# Patient Record
Sex: Female | Born: 1960 | Race: White | Hispanic: No | Marital: Married | Smoking: Former smoker
Health system: Southern US, Community
[De-identification: ages and names within clinical notes are randomized; demographics above are authoritative.]

## PROBLEM LIST (undated history)

## (undated) DIAGNOSIS — K219 Gastro-esophageal reflux disease without esophagitis: Secondary | ICD-10-CM

## (undated) DIAGNOSIS — G4700 Insomnia, unspecified: Secondary | ICD-10-CM

## (undated) DIAGNOSIS — R234 Changes in skin texture: Secondary | ICD-10-CM

## (undated) DIAGNOSIS — R51 Headache: Secondary | ICD-10-CM

## (undated) DIAGNOSIS — R102 Pelvic and perineal pain: Secondary | ICD-10-CM

## (undated) DIAGNOSIS — E785 Hyperlipidemia, unspecified: Secondary | ICD-10-CM

## (undated) DIAGNOSIS — G8929 Other chronic pain: Secondary | ICD-10-CM

## (undated) DIAGNOSIS — N80129 Deep endometriosis of ovary, unspecified ovary: Secondary | ICD-10-CM

## (undated) DIAGNOSIS — R159 Full incontinence of feces: Secondary | ICD-10-CM

## (undated) DIAGNOSIS — Z8679 Personal history of other diseases of the circulatory system: Secondary | ICD-10-CM

## (undated) DIAGNOSIS — F329 Major depressive disorder, single episode, unspecified: Secondary | ICD-10-CM

## (undated) DIAGNOSIS — R519 Headache, unspecified: Secondary | ICD-10-CM

## (undated) DIAGNOSIS — N809 Endometriosis, unspecified: Secondary | ICD-10-CM

## (undated) DIAGNOSIS — F32A Depression, unspecified: Secondary | ICD-10-CM

## (undated) DIAGNOSIS — E669 Obesity, unspecified: Secondary | ICD-10-CM

## (undated) DIAGNOSIS — I10 Essential (primary) hypertension: Secondary | ICD-10-CM

## (undated) HISTORY — PX: APPENDECTOMY: SHX54

## (undated) HISTORY — DX: Hyperlipidemia, unspecified: E78.5

## (undated) HISTORY — DX: Gastro-esophageal reflux disease without esophagitis: K21.9

## (undated) HISTORY — DX: Pelvic and perineal pain: R10.2

## (undated) HISTORY — PX: UPPER GASTROINTESTINAL ENDOSCOPY: SHX188

## (undated) HISTORY — DX: Obesity, unspecified: E66.9

## (undated) HISTORY — DX: Endometriosis, unspecified: N80.9

## (undated) HISTORY — DX: Changes in skin texture: R23.4

## (undated) HISTORY — DX: Essential (primary) hypertension: I10

## (undated) HISTORY — PX: LEFT OOPHORECTOMY: SHX1961

## (undated) HISTORY — DX: Headache, unspecified: R51.9

## (undated) HISTORY — DX: Insomnia, unspecified: G47.00

## (undated) HISTORY — DX: Depression, unspecified: F32.A

## (undated) HISTORY — DX: Headache: R51

## (undated) HISTORY — DX: Major depressive disorder, single episode, unspecified: F32.9

## (undated) HISTORY — DX: Other chronic pain: G89.29

## (undated) HISTORY — DX: Deep endometriosis of ovary, unspecified ovary: N80.129

## (undated) HISTORY — PX: CARPAL TUNNEL RELEASE: SHX101

## (undated) HISTORY — DX: Full incontinence of feces: R15.9

## (undated) HISTORY — PX: BURCH PROCEDURE: SHX1273

---

## 1992-12-12 HISTORY — PX: TOTAL ABDOMINAL HYSTERECTOMY: SHX209

## 1997-12-12 HISTORY — PX: OTHER SURGICAL HISTORY: SHX169

## 1999-04-14 ENCOUNTER — Ambulatory Visit (HOSPITAL_COMMUNITY): Admission: RE | Admit: 1999-04-14 | Discharge: 1999-04-14 | Payer: Self-pay | Admitting: Family Medicine

## 1999-04-14 ENCOUNTER — Encounter: Payer: Self-pay | Admitting: Family Medicine

## 2000-03-14 ENCOUNTER — Other Ambulatory Visit: Admission: RE | Admit: 2000-03-14 | Discharge: 2000-03-14 | Payer: Self-pay | Admitting: Family Medicine

## 2000-05-11 ENCOUNTER — Inpatient Hospital Stay (HOSPITAL_COMMUNITY): Admission: RE | Admit: 2000-05-11 | Discharge: 2000-05-12 | Payer: Self-pay | Admitting: Gynecology

## 2000-05-11 ENCOUNTER — Encounter (INDEPENDENT_AMBULATORY_CARE_PROVIDER_SITE_OTHER): Payer: Self-pay | Admitting: Specialist

## 2001-05-15 ENCOUNTER — Other Ambulatory Visit: Admission: RE | Admit: 2001-05-15 | Discharge: 2001-05-15 | Payer: Self-pay | Admitting: Gynecology

## 2002-05-17 ENCOUNTER — Other Ambulatory Visit: Admission: RE | Admit: 2002-05-17 | Discharge: 2002-05-17 | Payer: Self-pay | Admitting: Gynecology

## 2003-06-03 ENCOUNTER — Other Ambulatory Visit: Admission: RE | Admit: 2003-06-03 | Discharge: 2003-06-03 | Payer: Self-pay | Admitting: Gynecology

## 2003-10-15 ENCOUNTER — Encounter: Admission: RE | Admit: 2003-10-15 | Discharge: 2003-10-15 | Payer: Self-pay | Admitting: Gynecology

## 2004-06-22 ENCOUNTER — Other Ambulatory Visit: Admission: RE | Admit: 2004-06-22 | Discharge: 2004-06-22 | Payer: Self-pay | Admitting: Gynecology

## 2004-11-16 ENCOUNTER — Encounter: Admission: RE | Admit: 2004-11-16 | Discharge: 2004-11-16 | Payer: Self-pay | Admitting: Gynecology

## 2005-06-23 ENCOUNTER — Other Ambulatory Visit: Admission: RE | Admit: 2005-06-23 | Discharge: 2005-06-23 | Payer: Self-pay | Admitting: Gynecology

## 2006-04-14 ENCOUNTER — Encounter: Admission: RE | Admit: 2006-04-14 | Discharge: 2006-04-14 | Payer: Self-pay | Admitting: Gynecology

## 2006-07-05 ENCOUNTER — Other Ambulatory Visit: Admission: RE | Admit: 2006-07-05 | Discharge: 2006-07-05 | Payer: Self-pay | Admitting: Gynecology

## 2007-12-13 HISTORY — PX: BREAST BIOPSY: SHX20

## 2008-05-08 DIAGNOSIS — F329 Major depressive disorder, single episode, unspecified: Secondary | ICD-10-CM | POA: Insufficient documentation

## 2008-05-08 DIAGNOSIS — N809 Endometriosis, unspecified: Secondary | ICD-10-CM | POA: Insufficient documentation

## 2008-05-08 DIAGNOSIS — E7800 Pure hypercholesterolemia, unspecified: Secondary | ICD-10-CM | POA: Insufficient documentation

## 2008-05-08 DIAGNOSIS — R110 Nausea: Secondary | ICD-10-CM | POA: Insufficient documentation

## 2008-05-08 DIAGNOSIS — R159 Full incontinence of feces: Secondary | ICD-10-CM | POA: Insufficient documentation

## 2008-05-08 DIAGNOSIS — G5600 Carpal tunnel syndrome, unspecified upper limb: Secondary | ICD-10-CM | POA: Insufficient documentation

## 2008-05-08 DIAGNOSIS — M722 Plantar fascial fibromatosis: Secondary | ICD-10-CM | POA: Insufficient documentation

## 2008-05-08 DIAGNOSIS — I10 Essential (primary) hypertension: Secondary | ICD-10-CM | POA: Insufficient documentation

## 2008-05-08 DIAGNOSIS — R498 Other voice and resonance disorders: Secondary | ICD-10-CM | POA: Insufficient documentation

## 2008-05-08 DIAGNOSIS — J45909 Unspecified asthma, uncomplicated: Secondary | ICD-10-CM | POA: Insufficient documentation

## 2008-05-08 DIAGNOSIS — E669 Obesity, unspecified: Secondary | ICD-10-CM | POA: Insufficient documentation

## 2008-05-08 DIAGNOSIS — J329 Chronic sinusitis, unspecified: Secondary | ICD-10-CM | POA: Insufficient documentation

## 2008-05-08 DIAGNOSIS — G4700 Insomnia, unspecified: Secondary | ICD-10-CM | POA: Insufficient documentation

## 2008-05-08 DIAGNOSIS — M7660 Achilles tendinitis, unspecified leg: Secondary | ICD-10-CM | POA: Insufficient documentation

## 2008-05-08 DIAGNOSIS — R109 Unspecified abdominal pain: Secondary | ICD-10-CM | POA: Insufficient documentation

## 2008-05-08 DIAGNOSIS — K219 Gastro-esophageal reflux disease without esophagitis: Secondary | ICD-10-CM | POA: Insufficient documentation

## 2008-05-08 DIAGNOSIS — R7309 Other abnormal glucose: Secondary | ICD-10-CM | POA: Insufficient documentation

## 2008-05-08 DIAGNOSIS — F411 Generalized anxiety disorder: Secondary | ICD-10-CM | POA: Insufficient documentation

## 2008-05-09 ENCOUNTER — Ambulatory Visit: Payer: Self-pay | Admitting: Internal Medicine

## 2008-05-20 ENCOUNTER — Ambulatory Visit: Payer: Self-pay | Admitting: Internal Medicine

## 2008-05-20 ENCOUNTER — Encounter: Payer: Self-pay | Admitting: Internal Medicine

## 2008-05-22 ENCOUNTER — Encounter: Payer: Self-pay | Admitting: Internal Medicine

## 2008-09-16 ENCOUNTER — Encounter: Admission: RE | Admit: 2008-09-16 | Discharge: 2008-09-16 | Payer: Self-pay | Admitting: Gynecology

## 2008-09-24 ENCOUNTER — Encounter (INDEPENDENT_AMBULATORY_CARE_PROVIDER_SITE_OTHER): Payer: Self-pay | Admitting: Diagnostic Radiology

## 2008-09-24 ENCOUNTER — Encounter: Admission: RE | Admit: 2008-09-24 | Discharge: 2008-09-24 | Payer: Self-pay | Admitting: Gynecology

## 2008-09-24 HISTORY — PX: MM BREAST STEREO BIOPSY LEFT (ARMC HX): HXRAD1824

## 2009-09-17 ENCOUNTER — Encounter: Admission: RE | Admit: 2009-09-17 | Discharge: 2009-09-17 | Payer: Self-pay | Admitting: Gynecology

## 2010-05-11 ENCOUNTER — Ambulatory Visit: Payer: Self-pay | Admitting: Internal Medicine

## 2010-05-13 ENCOUNTER — Telehealth: Payer: Self-pay | Admitting: Internal Medicine

## 2010-05-25 ENCOUNTER — Ambulatory Visit: Payer: Self-pay | Admitting: Internal Medicine

## 2010-05-27 ENCOUNTER — Encounter: Payer: Self-pay | Admitting: Internal Medicine

## 2010-09-20 ENCOUNTER — Encounter: Admission: RE | Admit: 2010-09-20 | Discharge: 2010-09-20 | Payer: Self-pay | Admitting: Gynecology

## 2010-09-27 ENCOUNTER — Encounter: Payer: Self-pay | Admitting: Internal Medicine

## 2011-01-11 NOTE — Progress Notes (Signed)
Summary: meds  Phone Note From Pharmacy Call back at [phone]   Caller: Charlotte Crumb tech Call For: Dr. Juanda Chance  Summary of Call: pt came to pharmacy to pick up meds but pharmacy doesnt have anything thing for pt... pt apparently didnt know what she was there to pick up, pharm tech is assuming it is Carafate... is pt supposed to be getting refill of this and if so, can the request be faxed in? Initial call taken by: Vallarie Mare,  May 13, 2010 2:17 PM  Follow-up for Phone Call        Patient told us at her office visit that she [facility], not [facility]. I have called and spoken to Maralyn Sago and she states that she will have it transferred from [place] to their facility.  Follow-up by: Lamona Curl CMA Duncan Dull),  May 13, 2010 2:40 PM

## 2011-01-11 NOTE — Miscellaneous (Signed)
Summary: rx for nexium  Clinical Lists Changes  Medications: Added new medication of NEXIUM 40 MG  CPDR (ESOMEPRAZOLE MAGNESIUM) 1 capsule twice a day 30 minutes before meals - Signed Rx of NEXIUM 40 MG  CPDR (ESOMEPRAZOLE MAGNESIUM) 1 capsule twice a day 30 minutes before meals;  #60 x 3;  Signed;  Entered by: Oda Cogan RN;  Authorized by: Hart Carwin MD;  Method used: Electronically to [facility] [address] [place], [place]  [address], Ph: [phone] or [phone], Fax: [phone]    Prescriptions: NEXIUM 40 MG  CPDR (ESOMEPRAZOLE MAGNESIUM) 1 capsule twice a day 30 minutes before meals  #60 x 3   Entered by:   Oda Cogan RN   Authorized by:   Hart Carwin MD   Signed by:   Oda Cogan RN on 05/25/2010   Method used:   Electronically to        [facility]* (retail)       [address] [place], [place]  [address]       Ph: [phone] or [phone]       Fax: [phone]   RxID:   [id number]

## 2011-01-11 NOTE — Assessment & Plan Note (Signed)
Summary: spitting up blood...as.   History of Present Illness Visit Type: Follow-up Visit Primary GI MD: [name] MD Primary Provider: Donald Prose Requesting Provider: na Chief Complaint: spitting up blood, and burning in throat  History of Present Illness:   This is a 50 year old white female with a history of gastroesophageal reflux initially evaluated in June 2005 with an upper endoscopy which showed findings of mild gastritis. She responded to Nexium 40 mg twice a day, Reglan and Carafate. She now has a recurrence of the coughing, burning in her throat and spitting up some blood tinged sputum. She does not smoke. An evaluation by an Allergist was negative. She has some odynophagia but no dysphagia. Current medications include Prilosec 20 mg twice a day and Zantac 300 mg in the middle of the day.   GI Review of Systems      Denies abdominal pain, acid reflux, belching, bloating, chest pain, dysphagia with liquids, dysphagia with solids, heartburn, loss of appetite, nausea, vomiting, vomiting blood, weight loss, and  weight gain.        Denies anal fissure, black tarry stools, change in bowel habit, constipation, diarrhea, diverticulosis, fecal incontinence, heme positive stool, hemorrhoids, irritable bowel syndrome, jaundice, light color stool, liver problems, rectal bleeding, and  rectal pain.    Current Medications (verified): 1)  Atenolol 50 Mg  Tabs (Atenolol) .... Take 1 Tablet By Mouth Once A Day 2)  Multivitamins   Tabs (Multiple Vitamin) .... Take 1 Tablet By Mouth Once A Day 3)  Vitamin B-12 Cr 1000 Mcg  Tbcr (Cyanocobalamin) .... Take 2 1/2 Tablets By Mouth Every Day 4)  Fluticasone Propionate 50 Mcg/act  Susp (Fluticasone Propionate) .... Insert 2 Sprays Into Each Nostril Every Day. 5)  Omeprazole 20 Mg  Tbec (Omeprazole) .... Take 1 Tablet By Mouth Two Times A Day 6)  Ranitidine Hcl 300 Mg  Caps (Ranitidine Hcl) .... Hs 7)  Fish Oil 1000 Mg  Caps (Omega-3 Fatty  Acids) .... Take 6000mg  Daily 8)  Cvs Folic Acid 400 Mcg  Tabs (Folic Acid) .... Take 1200mg  Daily 9)  Cymbalta 60 Mg Cpep (Duloxetine Hcl) .... One Tablet By Mouth Once Daily 10)  Citracal Plus Bone Density  Tabs (Multiple Minerals-Vitamins) .... Two Times A Day By Mouth 11)  Liquid Magnesium 400mg  .... Once Daily  Allergies (verified): 1)  ! Sulfa  Past History:  Past Medical History: Reviewed history from 05/08/2008 and no changes required. 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 Total Abdominal Hysterectomy Left Salpingo-oophorectomy Burch prcedure & culdoplasty for stress urinary incontinence Anal sphincter repair-internal & external Right carpal tunnel surgery  Family History: Family History of Diabetes:  Family History of Breast Cancer: Family History of Liver Cancer: Family History of Heart Disease:  Family History of Irritable Bowel Syndrome: No FH of Colon Cancer:  Social History: Reviewed history from 05/09/2008 and no changes required. Alcohol Use - yes- 2 drinks weekly Daily Caffeine Use Patient is a former smoker.   Review of Systems       The patient complains of sore throat.  The patient denies allergy/sinus, anemia, anxiety-new, arthritis/joint  pain, back pain, blood in urine, breast changes/lumps, change in vision, confusion,  cough, coughing up blood, depression-new, fainting, fatigue, fever, headaches-new, hearing problems, heart murmur, heart rhythm changes, itching, menstrual pain, muscle pains/cramps, night sweats, nosebleeds, pregnancy symptoms, shortness of breath, skin rash, sleeping problems, swelling of feet/legs, swollen lymph glands, thirst - excessive , urination - excessive , urination changes/pain, urine leakage, vision changes, and voice change.         Pertinent positive and negative review of systems were noted in the above HPI. All other ROS was otherwise negative.   Vital Signs:  Patient profile:   50 year old female Height:      67 inches Weight:      217 pounds BMI:     34.11 BSA:     2.10 Pulse rate:   72 / minute Pulse rhythm:   regular BP sitting:   120 / 76  (right arm) Cuff size:   regular  Vitals Entered By: Ok Anis CMA (May 11, 2010 2:13 PM)  Physical Exam  General:  alert, oriented and in no distress. Overweight. Eyes:  PERRLA, no icterus. Mouth:  No deformity or lesions, dentition normal. Neck:  Supple; no masses or thyromegaly. Chest Wall:  costochondral junctions not tender. Lungs:  Clear throughout to auscultation. Heart:  Regular rate and rhythm; no murmurs, rubs,  or bruits. Abdomen:  protuberant abdomen with normoactive bowel sounds and no focal tenderness. Extremities:  No clubbing, cyanosis, edema or deformities noted. Skin:  Intact without significant lesions or rashes. Psych:  Alert and cooperative. Normal mood and affect.   Impression & Recommendations:  Problem # 1:  GERD (ICD-530.81) Patient has recurrent burning of the throat and hoarseness with a prior history of severe gastroesophageal reflux poorly controlled on a proton pump inhibitor. We will proceed with an upper endoscopy to rule out Barrett's esophagus. I have given her samples of Nexium 40 mg twice a day in place of Prilosec. She will stay on antireflux measures and add Carafate slurry 10 cc p.o.  b.i.d.  Other Orders: EGD (EGD)  Patient Instructions: 1)  atireflux measures. 2)  Nexium 40 mg p.o. b.i.d. 3)  Carafate slurry 10 cc p.o. b.i.d. 4)  Upper endoscopy with biopsies. 5)  Copy sent to : Dr Judie Petit.Hamrick 6)  The medication list was reviewed and reconciled.  All changed / newly prescribed medications were explained.  A complete medication list was provided to the patient / caregiver. Prescriptions: CARAFATE 1 GM/10ML SUSP (SUCRALFATE) Take 10 cc (2 teaspoons) by mouth two times a day  #12 ounces x 1   Entered by:   Lamona Curl CMA (AAMA)   Authorized by:   Hart Carwin MD   Signed by:   Lamona Curl CMA (AAMA) on 05/11/2010   Method used:   Electronically to        [facility] (retail)       [address]       [place], [place]  [address]       Ph: [phone]       Fax: [phone]   RxID:   [id number]

## 2011-01-11 NOTE — Letter (Signed)
Summary: EGD Instructions  [facility]  [address] [place], [place] [address]   Phone: [phone]  Fax: [phone]       Jordan Christensen    03/24/1961    MRN: [id number]       Procedure Day /Date: 05/25/10 Tuesday     Arrival Time:  1:30 pm     Procedure Time: 2:30 pm     Location of Procedure:                    _x  _ [facility] (4th Floor)  PREPARATION FOR ENDOSCOPY   On 05/25/10 THE DAY OF THE PROCEDURE:  1.   No solid foods, milk or milk products are allowed after midnight the night before your procedure.  2.   Do not drink anything colored red or purple.  Avoid juices with pulp.  No orange juice.  3.  You may drink clear liquids until 12:30 pm, which is 2 hours before your procedure.                                                                                                CLEAR LIQUIDS INCLUDE: Water Jello Ice Popsicles Tea (sugar ok, no milk/cream) Powdered fruit flavored drinks Coffee (sugar ok, no milk/cream) Gatorade Juice: apple, white grape, white cranberry  Lemonade Clear bullion, consomm, broth Carbonated beverages (any kind) Strained chicken noodle soup Hard Candy   MEDICATION INSTRUCTIONS  Unless otherwise instructed, you should take regular prescription medications with a small sip of water as early as possible the morning of your procedure.                  OTHER INSTRUCTIONS  You will need a responsible adult at least 48 years of age to accompany you and drive you home.   This person must remain in the waiting room during your procedure.  Wear loose fitting clothing that is easily removed.  Leave jewelry and other valuables at home.  However, you may wish to bring a book to read or an iPod/MP3 player to listen to music as you wait for your procedure to start.  Remove all body piercing jewelry and leave at home.  Total time from sign-in until discharge is approximately 2-3 hours.  You should  go home directly after your procedure and rest.  You can resume normal activities the day after your procedure.  The day of your procedure you should not:   Drive   Make legal decisions   Operate machinery   Drink alcohol   Return to work  You will receive specific instructions about eating, activities and medications before you leave.    The above instructions have been reviewed and explained to me by   Lamona Curl CMA Duncan Dull)  May 11, 2010 2:55 PM     I fully understand and can verbalize these instructions _____________________________ Date 05/11/10

## 2011-01-11 NOTE — Letter (Signed)
Summary: Patient [facility] Biopsy Results  [facility]  [address] [place], [place] [address]   Phone: [phone]  Fax: [phone]        May 27, 2010 MRN: [id number]    [facility] [address] [place], [place]  [address]    Dear Ms. Manwarren,  I am pleased to inform you that the biopsies taken during your recent endoscopic examination did not show any evidence of cancer upon pathologic examination.Biopsy from Your esophagus shows mild esophagitis due to reflux  Additional information/recommendations:  __No further action is needed at this time.  Please follow-up with      your primary care physician for your other healthcare needs.  __ Please call [phone] to schedule a return visit to review      your condition.  _x_ Continue with the treatment plan as outlined on the day of your      exam.  __   Please call us if you are having persistent problems or have questions about your condition that have not been fully answered at this time.  Sincerely,  Hart Carwin MD  This letter has been electronically signed by your physician.  Appended Document: Patient Notice-Endo Biopsy Results letter mailed.

## 2011-01-11 NOTE — Miscellaneous (Signed)
Summary: Nexium Rx  Clinical Lists Changes  Medications: Removed medication of RANITIDINE HCL 300 MG  CAPS (RANITIDINE HCL) hs Removed medication of OMEPRAZOLE 20 MG  TBEC (OMEPRAZOLE) Take 1 tablet by mouth two times a day Changed medication from NEXIUM 40 MG  CPDR (ESOMEPRAZOLE MAGNESIUM) 1 capsule twice a day 30 minutes before meals to NEXIUM 40 MG  CPDR (ESOMEPRAZOLE MAGNESIUM) 1 capsule twice a day 30 minutes before meals - Signed Rx of NEXIUM 40 MG  CPDR (ESOMEPRAZOLE MAGNESIUM) 1 capsule twice a day 30 minutes before meals;  #60 x 3;  Signed;  Entered by: Lamona Curl CMA (AAMA);  Authorized by: Hart Carwin MD;  Method used: Electronically to [facility] [address] [place], [place], [place]  [address], Ph: [phone], Fax: [phone]    Prescriptions: NEXIUM 40 MG  CPDR (ESOMEPRAZOLE MAGNESIUM) 1 capsule twice a day 30 minutes before meals  #60 x 3   Entered by:   Lamona Curl CMA (AAMA)   Authorized by:   Hart Carwin MD   Signed by:   Lamona Curl CMA (AAMA) on 09/27/2010   Method used:   Electronically to        [facility] (retail)       [address]       [place], [place]  [address]       Ph: [phone]       Fax: [phone]   RxID:   [id number]   Appended Document: Nexium Rx originally sent [facility] in error. Dottie Nelson-Smith CMA Duncan Dull)  October 01, 2010 3:08 PM    Clinical Lists Changes  Medications: Rx of NEXIUM 40 MG  CPDR (ESOMEPRAZOLE MAGNESIUM) 1 capsule twice a day 30 minutes before meals;  #60 x 3;  Signed;  Entered by: Lamona Curl CMA (AAMA);  Authorized by: Hart Carwin MD;  Method used: Electronically to [facility]*, [address] [place], [place]  [address], Ph: [phone] or [phone], Fax: [phone]    Prescriptions: NEXIUM 40 MG  CPDR (ESOMEPRAZOLE MAGNESIUM) 1 capsule twice a day  30 minutes before meals  #60 x 3   Entered by:   Lamona Curl CMA (AAMA)   Authorized by:   Hart Carwin MD   Signed by:   Lamona Curl CMA (AAMA) on 10/01/2010   Method used:   Electronically to        [facility]* (retail)       [address] [place], [place]  [address]       Ph: [phone] or [phone]       Fax: [phone]   RxID:   [id number]

## 2011-01-11 NOTE — Procedures (Signed)
Summary: Upper Endoscopy  Patient: Jordan Christensen Note: All result statuses are Final unless otherwise noted.  Tests: (1) Upper Endoscopy (EGD)   EGD Upper Endoscopy       DONE     [facility]     [address]     [place], [place]  [address]           ENDOSCOPY PROCEDURE REPORT           PATIENT:  Jordan Christensen, Jordan Christensen  MR#:  [id number]     BIRTHDATE:  02-08-1961, 48 yrs. old  GENDER:  female           ENDOSCOPIST:  Hedwig Morton. Juanda Chance, MD     Referred by:  Burnell Blanks, M.D.           PROCEDURE DATE:  05/25/2010     PROCEDURE:  EGD with biopsy     ASA CLASS:  Class I     INDICATIONS:  GERD, heartburn EGD 2005 mild gastritis     now cough, regurgitation, improved on bid Nexiem           MEDICATIONS:   Versed 7 mg, Fentanyl 50 mcg     TOPICAL ANESTHETIC:  Exactacain Spray           DESCRIPTION OF PROCEDURE:   After the risks benefits and     alternatives of the procedure were thoroughly explained, informed     consent was obtained.  The  endoscope was introduced through the     mouth and advanced to the second portion of the duodenum, without     limitations.  The instrument was slowly withdrawn as the mucosa     was fully examined.     <<PROCEDUREIMAGES>>           irregular Z-line. slightly irregulat z-line with short tongue of     gastric mucosa With standard forceps, a biopsy was obtained and     sent to pathology (see image1 and image5).  Otherwise the     examination was normal (see image4, image3, and image2). no hiatal     hernia    Retroflexed views revealed no abnormalities.    The     scope was then withdrawn from the patient and the procedure     completed.           COMPLICATIONS:  None           ENDOSCOPIC IMPRESSION:     1) Irregular Z-line     2) Otherwise normal examination     no inflammatory changes     RECOMMENDATIONS:     1) Await biopsy results           REPEAT EXAM:  In 0 year(s) for.           ______________________________     Hedwig Morton.  Juanda Chance, MD           CC:           n.     eSIGNED:   Hedwig Morton. Antinio Sanderfer at 05/25/2010 03:03 PM           Laural Roes, [id number]  Note: An exclamation mark (!) indicates a result that was not dispersed into the flowsheet. Document Creation Date: 05/25/2010 3:04 PM _______________________________________________________________________  (1) Order result status: Final Collection or observation date-time: 05/25/2010 14:57 Requested date-time:  Receipt date-time:  Reported date-time:  Referring Physician:   Ordering Physician: [name] [phone]) Specimen Source:  Source: Kem Parkinson  Filler Order Number: [id number] Lab site:

## 2011-04-06 ENCOUNTER — Telehealth: Payer: Self-pay | Admitting: Internal Medicine

## 2011-04-06 NOTE — Telephone Encounter (Signed)
Patient states that the pharmacy told her they have faxed a prior authorization request to [place] on 2 separate occasions and that we have not yet responded. I have contacted [facility]. They state that they have faxed request to [phone]. I have asked them to fax the request once more to [phone]. They state that they will do so.

## 2011-04-07 NOTE — Telephone Encounter (Signed)
Actually, I have already gotten approval from [employer] but they are unable to locate appropriate paperwork in medical records showing that this has already been completed. Unfortunately, there is also no available phone numbers for [employer], only fax numbers. Therefore, I will fax over another authorization request form to see if they can put it through again.

## 2011-04-08 NOTE — Telephone Encounter (Signed)
I have gotten approval from [employer] for patient's Nexium #60 per 30 day period. I have sent the approval to medical records to be scanned in and have contacted patients pharmacy to advise them of the approval.

## 2011-04-08 NOTE — Telephone Encounter (Signed)
Advised patient that her Nexium has been approved through [employer].

## 2011-04-29 NOTE — Op Note (Signed)
[facility]  Patient:    ARTICE, HOLOHAN                      MRN: [id number] Proc. Date: 05/11/00 Adm. Date:  [id number] Attending:  Katrina Stack CC:         Luna Fuse, M.D.             Gretta Cool, M.D.                           Operative Report  PREOPERATIVE DIAGNOSES: 1. Incapacitating cyclic pelvic pain, post-total abdominal hysterectomy,    left salpingo-oophorectomy, and post-diagnostic laparoscopy and resection    of endometrioma, elsewhere, for severe endometriosis. 2. Incontinence of stool and gas, with previous obstetric laceration of    anal sphincter, elsewhere.  POSTOPERATIVE DIAGNOSES: 1. Incapacitating cyclic pelvic pain, post-total labdominal abdominal hysterectomy,    left salpingo-oophorectomy, and post-diagnostic laparoscopy and resection    of endometrioma elsewhere, for severe endometriosis. 2. Incontinence of stool and gas, with previous obstetric laceration of    anal sphincter, elsewhere.  PROCEDURE: 1. Laparoscopic right salpingo-oophorectomy. 2. Anal sphincter repair internal and external.  SURGEON:  Gretta Cool, M.D.  ASSISTANT:  Raynald Kemp, M.D.  ANESTHESIA:  General.  DESCRIPTION OF PROCEDURE:  Under excellent general anesthesia with the patients  abdomen prepped and draped in a modified lithotomy position, and Allen stirrups, a subumbilical incision was made, and a Veress cannula introduced.  After adequate pneumoperitoneum, the laparoscope and trocar were introduced and the pelvic organs visualized.  There was minimal adhesion in the pelvis, except to the left lateral pelvic wall, where the sigmoid colon was adherent.  On the right the ovary was covered with relatively dense-looking adhesions.  With nonsuppressive oral contraceptive therapy, her ovary was quite reduced in size.  It was suspended to the round ligament.  At this point accessory trocars were placed under  vision and the ovary and tube grasped with self-retaining forceps.  The Seitzinger tripolar forceps 5 mm size were then used to transect the round ligament once again, and  then to transect the infundibular pelvic vessels.  The intervening peritoneum was then also transected with a Seitzinger.  The Seitzinger tripolar forceps were then used to section the ovary to a small enough size so that it could be delivered n one piece through the laparoscope 10 mm port.  The entire ovary and fallopian tube was then delivered as one specimen through the 10 mm port.  At this point the pelvis was irrigated with lactated Ringers to remove any remaining debris.  At this point the fluid was evacuated and the gas allowed to escape.  The incisions were then closed with a deep suture of #5-0 Dexon.  Skin closure with Steri-Strips.  At the end of the procedure the patient was then reprepped and redraped for the  vaginal portion of the procedure, that is the anal sphincter repair.  An anal sphincter repair was begun by an incision over the anal sphincter, so as to remove the skin over the area of extensive loss of perineal body musculature and anal sphincters.  The incision was carried down to the scarred fascia from previous repair.  The ends of the anal sphincter were dissected free and then grasped with Allis clamps. The ends of the sphincter were then freshened.  The edges of the internal anal sphincter were then also identified and  plicated for a distance of approximately 3.0 cm down, above the anus.  The internal sphincter was closed with interrupted mattress sutures of #3-0 Vicryl.  Next, the edges of the anal sphincter were freshened and the anal sphincter was closed in an end-to-end anastomosis rather than an overlapping repair, because it was felt that the capsule of the nal sphincter muscle could be more physiologically plicated in this patient.  At this point, a circumferential 360  degree repair was undertaken using #3-0 PDS, and mattress sutures to include the entire capsule and the muscle tissue of the anal sphincter.  The sutures were placed approximately 3.0 to 4.0 mm apart, 365 degrees around the anus.  At this point, the accessory perineal body musculature was plicated in the midline, and the skin closed with subcuticular #3-0 PDS.  The skin was then also approximated with DermaBond, so as to seal particularly the anal kin and the skin of the perineal body, down to the margin of the anus, to prevent contamination from subsequent cleansing in the postoperative period.  At this point the procedure was terminated without complications.  The patient returned to the recovery room in excellent condition. DD:  05/11/00 TD:  05/11/00 Job: [id number]

## 2011-04-29 NOTE — H&P (Signed)
[facility]  Patient:    Jordan Christensen, Jordan Christensen                      MRN: [id number] Adm. Date:  [id number] Attending:  Katrina Stack CC:         Gretta Cool, M.D. (office x 2)                         History and Physical  CHIEF COMPLAINT:  Recurrent incapacitating cyclic pelvic pain, right lower quadrant.  HISTORY OF PRESENT ILLNESS:  A 50 year old white, married, gravida 2, para 2 with a history of recurring endometrioma left ovary with history of conservative management of endometrioma of the left ovary in 1992 by Dr. Arlyce Dice.  She had recurrence of endometrioma from 1993 and subsequently underwent total abdominal hysterectomy and left salpingo-oophorectomy for definitive therapy.  She also had involvement of the right ovary with multiple ___ type implants at the time of that procedure.  In addition, she had Burch procedure and culdoplasty for stress urinary incontinence.  Initially, she was much improved but now has recurrence of incapacitating right lower quadrant pain that is cyclic in nature and recurs each month during the time that she has breast sensitivity.  She has also had recurring cystic enlargement of her right ovary documented previously by ultrasound suggesting significant adhesion over the ovarian surface.  She also has difficulty with incontinence of gas and liquid stool increasingly severe in degree.  On examination of her anus, she has a dovetail sign with loss of anterior radiation of the anal sphincter.  She also has ultrasound documented loss of the anal sphincter anteriorly, both internal and external. On anal and vaginal examination, she has a thickness of only a few mm compatible with anal mucosa in direct apposition to the vaginal mucosa.  She appears to control reasonably well by constipation and by use of her levator muscles as accessory control.  She wishes definitive repair of that as well. I have discussed with her  the risks, benefits and the long-term risks of failure if she returns to a lifelong pattern of constipation and straining at stool. She is now admitted for definitive therapy by diagnostic laparoscopy, right salpingo-oophorectomy, possible laparotomy and for anal sphincter repair.  PAST MEDICAL HISTORY:  Usual childhood diseases without sequelae.  Medical illnesses:  None of consequence.  Accidents/injuries:  None.  Allergies: Multiple attack allergen, on Claritin.  DRUG ALLERGIES:  MACROBID.  PREVIOUS SURGERY: 1. Diagnostic laparoscopy in 1992 with laser excision of ovarian endometrioma. 2. In 1993, hysterectomy total and left salpingo-oophorectomy, Burch procedure    and culdoplasty, Dr. Arlyce Dice.  SOCIAL HISTORY:  Occasional social ethanol, denies tobacco.  Two living children, one is a teen.  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  Her husband works with the [employer] of [employer].  REVIEW OF SYSTEMS:  HEENT:  Denies symptoms.  CARDIORESPIRATORY:  Denies asthma, cough, bronchitis, shortness of breath.  GI/GU:  Recurring UTIs and cystitis.  No history of pyelonephritis.  PHYSICAL EXAMINATION:  GENERAL:  Well-developed, well-nourished white female, moderately over ideal weight.  HEENT:  Pupils equal, react to light and accommodation.  Fundi not examined. Oropharynx clear.  NECK:  Supple without masses or enlargement.  CHEST:  Clear to P&A.  BREASTS:  Soft without mass, nodes, nipple discharge.  HEART:  Regular rhythm without murmur or cardiac enlargement.  ABDOMEN:  Soft, scaphoid without mass or organomegaly.  PELVIC EXAM:  External genitalia normal female.  Vagina clean, rugous. Cervix and uterus surgically absent.  Vaginal cuff is well supported, as is the anterior vaginal wall.  The posterior vaginal wall support is reasonably intact with the exception of the anal sphincter.  There is no more than a few mm of tissue between the vaginal and rectal examining fingers and  there is clear evidence of disruption of the anal sphincter, both internal and external.  Much of the perineal body musculature is also separated widely.  RECTAL EXAM:  No masses. DD:  05/11/00 TD:  05/11/00 Job: [id number]

## 2011-05-27 ENCOUNTER — Other Ambulatory Visit: Payer: Self-pay | Admitting: Gynecology

## 2011-07-11 ENCOUNTER — Other Ambulatory Visit: Payer: Self-pay | Admitting: *Deleted

## 2011-07-11 MED ORDER — ESOMEPRAZOLE MAGNESIUM 40 MG PO CPDR: 40.0000 mg | DELAYED_RELEASE_CAPSULE | Freq: Two times a day (BID) | ORAL | Status: DC

## 2011-08-19 ENCOUNTER — Ambulatory Visit (INDEPENDENT_AMBULATORY_CARE_PROVIDER_SITE_OTHER): Payer: BC Managed Care – PPO | Admitting: Internal Medicine

## 2011-08-19 ENCOUNTER — Encounter: Payer: Self-pay | Admitting: Internal Medicine

## 2011-08-19 VITALS — BP 132/80 | HR 68 | Ht 67.0 in | Wt 242.0 lb

## 2011-08-19 DIAGNOSIS — K219 Gastro-esophageal reflux disease without esophagitis: Secondary | ICD-10-CM

## 2011-08-19 DIAGNOSIS — K2270 Barrett's esophagus without dysplasia: Secondary | ICD-10-CM

## 2011-08-19 MED ORDER — ESOMEPRAZOLE MAGNESIUM 40 MG PO CPDR: 40.0000 mg | DELAYED_RELEASE_CAPSULE | Freq: Two times a day (BID) | ORAL | Status: DC

## 2011-08-19 NOTE — Patient Instructions (Signed)
We have sent the following medications to your pharmacy for you to pick up at your convenience: Nexium You may contact someone to sign up for lapband information session at [phone]. You will be due for a recall endoscopy/colonoscopy in 07/2013. We will send you a reminder in the mail when it gets closer to that time. CC: Dr Nathanial Rancher

## 2011-08-19 NOTE — Progress Notes (Signed)
Jordan Christensen July 30, 1961 MRN [id number]   History of Present Illness:  This is a 50 year old white female with severe gastroesophageal reflux currently well-controlled on Nexium 40 mg by mouth twice a day. She was diagnosed with Barrett's esophagus on an upper endoscopy in June 2009. His last endoscopy in June 2011 did not confirm Barrett's esophagus. She is here to refill her Nexium. She denies dysphagia, odynophagia, sweats or cough. She is interested in lap-band surgery for weight reduction.   Past Medical History  Diagnosis Date  . Endometrioma     hx  . Chronic pelvic pain in female   . Fecal incontinence   . Hyperlipidemia   . Depression   . Insomnia   . Hypertension   . Obesity   . Chronic sinusitis   . Asthma   . GERD (gastroesophageal reflux disease)    Past Surgical History  Procedure Date  . Total abdominal hysterectomy   . Left oophorectomy   . Burch procedure     and culdoplasty for stress urinary incontinence  . Anal sphincter repair     internal and external  . Carpal tunnel release     right    reports that she has quit smoking. She has never used smokeless tobacco. She reports that she drinks about one ounce of alcohol per week. She reports that she does not use illicit drugs. family history includes Breast cancer in her maternal aunt; Diabetes in her mother; Heart disease in her mother; and Liver cancer in her father.  There is no history of Colon cancer. Allergies  Allergen Reactions  . Sulfonamide Derivatives         Review of Systems:  The remainder of the 10  point ROS is negative except as outlined in H&P   Physical Exam: General appearance  Well developed, in no distress. Eyes- non icteric. HEENT nontraumatic, normocephalic. Mouth no lesions, tongue papillated, no cheilosis. Neck supple without adenopathy, thyroid not enlarged, no carotid bruits, no JVD. Lungs Clear to auscultation bilaterally. Cor normal S1 normal S2, regular rhythm ,  no murmur,  quiet precordium. Abdomen soft, nontender normoactive bowel sounds. Rectal: Deferred. Extremities no pedal edema. Skin no lesions. Neurological alert and oriented x 3. Psychological normal mood and affect.  Assessment and Plan:  Problem #1 Chronic gastroesophageal reflux which is well controlled on Nexium 40 mg by mouth twice a day. She should continue antireflux measures. We will refill Nexium 40 by mouth twice a day. A telephone number contact was given to the patient to schedule instructions on gastric bypass and lapband surgery. A recall upper endoscopy will be due in August 2014. She will at that time have a screening colonoscopy as well.   08/19/2011 [name]

## 2011-09-07 ENCOUNTER — Other Ambulatory Visit: Payer: Self-pay | Admitting: Gynecology

## 2011-09-07 DIAGNOSIS — Z1231 Encounter for screening mammogram for malignant neoplasm of breast: Secondary | ICD-10-CM

## 2011-09-22 ENCOUNTER — Ambulatory Visit
Admission: RE | Admit: 2011-09-22 | Discharge: 2011-09-22 | Disposition: A | Discharge status: Home / Self Care | Payer: BC Managed Care – PPO | Source: Ambulatory Visit | Attending: Gynecology | Admitting: Gynecology

## 2011-09-22 DIAGNOSIS — Z1231 Encounter for screening mammogram for malignant neoplasm of breast: Secondary | ICD-10-CM

## 2011-09-28 ENCOUNTER — Other Ambulatory Visit: Payer: Self-pay | Admitting: Gynecology

## 2011-09-28 DIAGNOSIS — R928 Other abnormal and inconclusive findings on diagnostic imaging of breast: Secondary | ICD-10-CM

## 2011-10-11 ENCOUNTER — Ambulatory Visit
Admission: RE | Admit: 2011-10-11 | Discharge: 2011-10-11 | Disposition: A | Discharge status: Home / Self Care | Payer: BC Managed Care – PPO | Source: Ambulatory Visit | Attending: Gynecology | Admitting: Gynecology

## 2011-10-11 DIAGNOSIS — R928 Other abnormal and inconclusive findings on diagnostic imaging of breast: Secondary | ICD-10-CM

## 2012-03-06 ENCOUNTER — Other Ambulatory Visit: Payer: Self-pay | Admitting: Gynecology

## 2012-03-06 DIAGNOSIS — R920 Mammographic microcalcification found on diagnostic imaging of breast: Secondary | ICD-10-CM

## 2012-03-20 ENCOUNTER — Ambulatory Visit
Admission: RE | Admit: 2012-03-20 | Discharge: 2012-03-20 | Disposition: A | Discharge status: Home / Self Care | Payer: BC Managed Care – PPO | Source: Ambulatory Visit | Attending: Gynecology | Admitting: Gynecology

## 2012-03-20 ENCOUNTER — Other Ambulatory Visit: Payer: Self-pay | Admitting: *Deleted

## 2012-03-20 DIAGNOSIS — R920 Mammographic microcalcification found on diagnostic imaging of breast: Secondary | ICD-10-CM

## 2012-03-20 MED ORDER — ESOMEPRAZOLE MAGNESIUM 40 MG PO CPDR: 40.0000 mg | DELAYED_RELEASE_CAPSULE | Freq: Two times a day (BID) | ORAL | Status: DC

## 2012-06-21 ENCOUNTER — Other Ambulatory Visit: Payer: Self-pay | Admitting: Gynecology

## 2012-07-17 ENCOUNTER — Other Ambulatory Visit: Payer: Self-pay | Admitting: *Deleted

## 2012-07-17 MED ORDER — ESOMEPRAZOLE MAGNESIUM 40 MG PO CPDR: 40.0000 mg | DELAYED_RELEASE_CAPSULE | Freq: Two times a day (BID) | ORAL | Status: DC

## 2012-08-15 ENCOUNTER — Other Ambulatory Visit: Payer: Self-pay | Admitting: Gynecology

## 2012-08-15 DIAGNOSIS — R921 Mammographic calcification found on diagnostic imaging of breast: Secondary | ICD-10-CM

## 2012-09-21 ENCOUNTER — Ambulatory Visit (INDEPENDENT_AMBULATORY_CARE_PROVIDER_SITE_OTHER): Payer: BC Managed Care – PPO | Admitting: Internal Medicine

## 2012-09-21 ENCOUNTER — Encounter: Payer: Self-pay | Admitting: Internal Medicine

## 2012-09-21 VITALS — BP 134/68 | HR 80 | Ht 67.0 in | Wt 194.0 lb

## 2012-09-21 DIAGNOSIS — K219 Gastro-esophageal reflux disease without esophagitis: Secondary | ICD-10-CM

## 2012-09-21 DIAGNOSIS — K2270 Barrett's esophagus without dysplasia: Secondary | ICD-10-CM

## 2012-09-21 MED ORDER — RANITIDINE HCL 150 MG PO TABS: 150.0000 mg | ORAL_TABLET | Freq: Every day | ORAL | Status: DC

## 2012-09-21 MED ORDER — ESOMEPRAZOLE MAGNESIUM 40 MG PO CPDR: 40.0000 mg | DELAYED_RELEASE_CAPSULE | Freq: Two times a day (BID) | ORAL | Status: DC

## 2012-09-21 NOTE — Patient Instructions (Addendum)
We have sent the following medications to your pharmacy for you to pick up at your convenience: Nexium Ranitadine You will be due for a recall endoscopy/colonoscopy in 07/2013. We will send you a reminder in the mail when it gets closer to that time. CC: Dr Burnell Blanks

## 2012-09-21 NOTE — Progress Notes (Signed)
CHLO… BLEAK 11/07/1961 MRN [id number]  History of Present Illness:  This is a 51 year old white female with a history of Barrett's esophagus on endoscopy in June 2009 and chronic gastroesophageal reflux which is only partially responsive to Nexium 40 mg twice a day. She has occasional dysphagia to solids. Her last upper endoscopy in June 2011 did not show any Barrett's esophagus. She has intentionally lost 50 pounds since last September 2012. She denies cough or hoarseness.   Past Medical History  Diagnosis Date  . Endometrioma     hx  . Chronic pelvic pain in female   . Fecal incontinence   . Hyperlipidemia   . Depression   . Insomnia   . Hypertension   . Obesity   . Chronic sinusitis   . Asthma   . GERD (gastroesophageal reflux disease)    Past Surgical History  Procedure Date  . Total abdominal hysterectomy   . Left oophorectomy   . Burch procedure     and culdoplasty for stress urinary incontinence  . Anal sphincter repair     internal and external  . Carpal tunnel release     right    reports that she has quit smoking. She has never used smokeless tobacco. She reports that she drinks about one ounce of alcohol per week. She reports that she does not use illicit drugs. family history includes Breast cancer in her maternal aunt; Diabetes in her mother; Heart disease in her mother; and Liver cancer in her father.  There is no history of Colon cancer. Allergies  Allergen Reactions  . Sulfonamide Derivatives         Review of Systems: Negative for abdominal pain or change in bowel habits  The remainder of the 10 point ROS is negative except as outlined in H&P   Physical Exam: General appearance  Well developed, in no distress. Eyes- non icteric. HEENT nontraumatic, normocephalic. Mouth no lesions, tongue papillated, no cheilosis. Neck supple without adenopathy, thyroid not enlarged, no carotid bruits, no JVD. Lungs Clear to auscultation bilaterally. Cor  normal S1, normal S2, regular rhythm, no murmur,  quiet precordium. Abdomen: Soft nontender. Rectal: Not done. Extremities no pedal edema. Skin no lesions. Neurological alert and oriented x 3. Psychological normal mood and affect.  Assessment and Plan:  Problem #1 Chronic gastroesophageal reflux disease refractory to proton pump inhibitors. We have discussed Nissen fundoplication but she would like to continue conservative treatment. We will add ranitidine 150 mg in the middle of the day. She has used antireflux measures including  head of the bed elevation and continued weight loss. She will be due for a recall upper endoscopy in August 2014.  Problem #2 Colorectal cancer screening. We will plan for her to have a colonoscopy in August 2014 at the time of her upper endoscopy.   09/21/2012 [name]

## 2012-09-24 ENCOUNTER — Ambulatory Visit
Admission: RE | Admit: 2012-09-24 | Discharge: 2012-09-24 | Disposition: A | Discharge status: Home / Self Care | Payer: BC Managed Care – PPO | Source: Ambulatory Visit | Attending: Gynecology | Admitting: Gynecology

## 2012-09-24 DIAGNOSIS — R921 Mammographic calcification found on diagnostic imaging of breast: Secondary | ICD-10-CM

## 2012-12-12 HISTORY — PX: COLONOSCOPY: SHX174

## 2013-01-21 ENCOUNTER — Other Ambulatory Visit: Payer: Self-pay | Admitting: Otolaryngology

## 2013-01-21 DIAGNOSIS — J329 Chronic sinusitis, unspecified: Secondary | ICD-10-CM

## 2013-01-22 ENCOUNTER — Ambulatory Visit
Admission: RE | Admit: 2013-01-22 | Discharge: 2013-01-22 | Disposition: A | Discharge status: Home / Self Care | Payer: BC Managed Care – PPO | Source: Ambulatory Visit | Attending: Otolaryngology | Admitting: Otolaryngology

## 2013-01-22 DIAGNOSIS — J329 Chronic sinusitis, unspecified: Secondary | ICD-10-CM

## 2013-01-26 ENCOUNTER — Other Ambulatory Visit: Payer: Self-pay

## 2013-06-25 ENCOUNTER — Encounter: Payer: Self-pay | Admitting: Internal Medicine

## 2013-08-20 ENCOUNTER — Ambulatory Visit (AMBULATORY_SURGERY_CENTER): Payer: Self-pay

## 2013-08-20 VITALS — Ht 67.0 in | Wt 225.0 lb

## 2013-08-20 DIAGNOSIS — Z1211 Encounter for screening for malignant neoplasm of colon: Secondary | ICD-10-CM

## 2013-08-20 DIAGNOSIS — K2270 Barrett's esophagus without dysplasia: Secondary | ICD-10-CM

## 2013-08-20 MED ORDER — MOVIPREP 100 G PO SOLR: ORAL | Status: DC

## 2013-08-22 ENCOUNTER — Encounter: Payer: Self-pay | Admitting: Internal Medicine

## 2013-09-04 ENCOUNTER — Encounter: Payer: Self-pay | Admitting: Internal Medicine

## 2013-09-04 ENCOUNTER — Ambulatory Visit (AMBULATORY_SURGERY_CENTER): Payer: BC Managed Care – PPO | Admitting: Internal Medicine

## 2013-09-04 VITALS — BP 155/86 | HR 86 | Temp 97.3°F | Resp 14 | Ht 67.0 in | Wt 225.0 lb

## 2013-09-04 DIAGNOSIS — Z1211 Encounter for screening for malignant neoplasm of colon: Secondary | ICD-10-CM

## 2013-09-04 DIAGNOSIS — D126 Benign neoplasm of colon, unspecified: Secondary | ICD-10-CM

## 2013-09-04 DIAGNOSIS — K219 Gastro-esophageal reflux disease without esophagitis: Secondary | ICD-10-CM

## 2013-09-04 DIAGNOSIS — K2270 Barrett's esophagus without dysplasia: Secondary | ICD-10-CM

## 2013-09-04 MED ORDER — SODIUM CHLORIDE 0.9 % IV SOLN: 500.0000 mL | INTRAVENOUS | Status: DC

## 2013-09-04 NOTE — Op Note (Signed)
[facility] [address] [place], [address]   COLONOSCOPY PROCEDURE REPORT  PATIENT: Jordan Christensen, Jordan Christensen  MR#: [id number] BIRTHDATE: 1961-01-29 , 52  yrs. old GENDER: Female ENDOSCOPIST: Hart Carwin, MD REFERRED XB:JYNWG Hamrick, M.D. PROCEDURE DATE:  09/04/2013 PROCEDURE:   Colonoscopy with cold biopsy polypectomy First Screening Colonoscopy - Avg.  risk and is 50 yrs.  old or older Yes.  Prior Negative Screening - Now for repeat screening. N/A  History of Adenoma - Now for follow-up colonoscopy & has been > or = to 3 yrs.  N/A  Polyps Removed Today? Yes. ASA CLASS:   Class II INDICATIONS:Average risk patient for colon cancer. MEDICATIONS: MAC sedation, administered by CRNA and propofol (Diprivan) 350mg  IV  DESCRIPTION OF PROCEDURE:   After the risks benefits and alternatives of the procedure were thoroughly explained, informed consent was obtained.  A digital rectal exam revealed no abnormalities of the rectum.   The LB [id number]  endoscope was introduced through the anus and advanced to the cecum, which was identified by both the appendix and ileocecal valve. No adverse events experienced.   The quality of the prep was good, using MoviPrep  The instrument was then slowly withdrawn as the colon was fully examined.      COLON FINDINGS: A smooth sessile polyp ranging between 5-33mm in size was found at the cecum., it resembled a lipoma  A polypectomy was performed with cold forceps.  The resection was complete and the polyp tissue was completely retrieved.  Retroflexed views revealed no abnormalities. The time to cecum=7 minutes 7 seconds. Withdrawal time=8 minutes 9 seconds.  The scope was withdrawn and the procedure completed. COMPLICATIONS: There were no complications.  ENDOSCOPIC IMPRESSION: Sessile polyp ranging between 5-68mm in size was found at the cecum; polypectomy was performed with cold forceps ,  lipoma vs polyp  see photos  RECOMMENDATIONS: 1.  Await pathology results 2.  High fiber diet   eSigned:  Hart Carwin, MD 09/04/2013 8:50 AM   cc:   PATIENT NAME:  Jordan Christensen, Jordan Christensen MR#: [id number]

## 2013-09-04 NOTE — Op Note (Signed)
[facility] [address] [place], [address]   ENDOSCOPY PROCEDURE REPORT  PATIENT: Jordan Christensen, Jordan Christensen  MR#: [id number] BIRTHDATE: 06-30-1961 , 52  yrs. old GENDER: Female ENDOSCOPIST: Hart Carwin, MD REFERRED BY:  Burnell Blanks, M.D. PROCEDURE DATE:  09/04/2013 PROCEDURE:  EGD w/ biopsy ASA CLASS:     Class II INDICATIONS:  History of esophageal reflux.   irregular z-line on prior EGD 05/2008 and 05/2010. MEDICATIONS: MAC sedation, administered by CRNA and propofol (Diprivan) 200mg  IV TOPICAL ANESTHETIC: Cetacaine Spray  DESCRIPTION OF PROCEDURE: After the risks benefits and alternatives of the procedure were thoroughly explained, informed consent was obtained.  The LB [id number] endoscope was introduced through the mouth and advanced to the second portion of the duodenum. Without limitations.  The instrument was slowly withdrawn as the mucosa was fully examined.      Esophagus: esophageal mucosa appeared normal in the proximal mid and distal esophagus. The Z line was irregular. There were 2 separate columns of gastric mucosa extending into the esophagus. There were no acute erosions or stricture. There was no hiatal hernia. Biopsies were taken from GE junction Stomach: The fourth dear normal. Gastric antrum and gastric outlet were unremarkable. Retroflexion of the scope revealed normal fundus and cardia Duodenum: Duodenal bulb and descending duodenum was normal[ The scope was then withdrawn from the patient and the procedure completed.  COMPLICATIONS: There were no complications. ENDOSCOPIC IMPRESSION: irregular Z line status post biopsies to rule out Barrett's esophagus Otherwise normal upper endoscopy of 12 esophagus stomach and duodenum RECOMMENDATIONS: Await pathology results antireflux measures Continue acid reducing agents  REPEAT EXAM: for EGD pending biopsy results.  eSigned:  Hart Carwin, MD 09/04/2013 8:45  AM   CC:  PATIENT NAME:  Phyillis, Dascoli MR#: [id number]

## 2013-09-04 NOTE — Progress Notes (Signed)
Patient did not experience any of the following events: a burn prior to discharge; a fall within the facility; wrong site/side/patient/procedure/implant event; or a hospital transfer or hospital admission upon discharge from the facility. (G8907) Patient did not have preoperative order for IV antibiotic SSI prophylaxis. (G8918)  

## 2013-09-04 NOTE — Patient Instructions (Addendum)

## 2013-09-04 NOTE — Progress Notes (Signed)
Report to pacu rn, vss, bbs=clear, had 3 puffs of her Rx Albuterol preop 0802, for Hx of Asthma.

## 2013-09-04 NOTE — Progress Notes (Signed)
Called to room to assist during endoscopic procedure.  Patient ID and intended procedure confirmed with present staff. Received instructions for my participation in the procedure from the performing physician.  

## 2013-09-05 ENCOUNTER — Telehealth: Payer: Self-pay | Admitting: *Deleted

## 2013-09-05 NOTE — Telephone Encounter (Signed)
  Follow up Call-  Call back number 09/04/2013  Post procedure Call Back phone  # [phone] cell  Permission to leave phone message Yes     Patient questions:  Do you have a fever, pain , or abdominal swelling? no Pain Score  0 *  Have you tolerated food without any problems? yes  Have you been able to return to your normal activities? yes  Do you have any questions about your discharge instructions: Diet   no Medications  no Follow up visit  no  Do you have questions or concerns about your Care? no  Actions: * If pain score is 4 or above: No action needed, pain <4.   

## 2013-09-05 NOTE — Telephone Encounter (Signed)
  Follow up Call-  Call back number 09/04/2013  Post procedure Call Back phone  # [phone] cell  Permission to leave phone message Yes     Patient questions:  Do you have a fever, pain , or abdominal swelling? no Pain Score  0 *  Have you tolerated food without any problems? yes  Have you been able to return to your normal activities? yes  Do you have any questions about your discharge instructions: Diet   no Medications  no Follow up visit  no  Do you have questions or concerns about your Care? no  Actions: * If pain score is 4 or above: No action needed, pain <4.

## 2013-09-09 ENCOUNTER — Encounter: Payer: Self-pay | Admitting: Internal Medicine

## 2013-09-11 ENCOUNTER — Other Ambulatory Visit: Payer: Self-pay

## 2013-09-11 DIAGNOSIS — Z1231 Encounter for screening mammogram for malignant neoplasm of breast: Secondary | ICD-10-CM

## 2013-09-26 ENCOUNTER — Ambulatory Visit
Admission: RE | Admit: 2013-09-26 | Discharge: 2013-09-26 | Disposition: A | Discharge status: Home / Self Care | Payer: BC Managed Care – PPO | Source: Ambulatory Visit

## 2013-09-26 DIAGNOSIS — Z1231 Encounter for screening mammogram for malignant neoplasm of breast: Secondary | ICD-10-CM

## 2013-10-17 ENCOUNTER — Other Ambulatory Visit: Payer: Self-pay

## 2013-10-22 ENCOUNTER — Other Ambulatory Visit: Payer: Self-pay | Admitting: Internal Medicine

## 2013-10-22 MED ORDER — ESOMEPRAZOLE MAGNESIUM 40 MG PO CPDR: 40.0000 mg | DELAYED_RELEASE_CAPSULE | Freq: Two times a day (BID) | ORAL | Status: DC

## 2013-10-22 NOTE — Telephone Encounter (Signed)
Left message for Mrs. Trabert to call back. She should be taking her Nexium every day,not PRN. Her last refill from [place] was around 03/2013. I will refill meds once I receive a return phone call.

## 2013-10-22 NOTE — Telephone Encounter (Signed)
Patient states that somehow, the pharmacist was getting refills for Nexium from her allergist. She has been taking her medication daily as prescribed. I have advised that I will send refills of Nexium to her pharmacy and the pharmacy will contact me if prior authorization is needed. She verbalizes understanding.

## 2013-10-29 ENCOUNTER — Telehealth: Payer: Self-pay | Admitting: Internal Medicine

## 2013-10-30 NOTE — Telephone Encounter (Signed)
Prior authorization request was sent to patient's pharmacy yesterday (I contacted the pharmacy since we had not received any correspondence from them and they said they sent prior auth request to [place]. Unfortunately, we did not receive it). Kiane with patient's insurance called and asked for clarification of what medications patient has tried. I again advised that patient has tried omeprazole 20 mg twice daily and Zantac 300 mg daily. She has gerd and history of barretts esophagus. Apparently insurance typically requires that patient try aciphex, pantoprazole, omeprazole and prevacid prior to approval of Nexium. They will contact us with a decision once prior auth has been fully reviewed. Patient has been advised of this and samples of Nexium (2 week supply) have been placed at the front desk for her to pick up since she is out of medication at this time.

## 2013-10-31 ENCOUNTER — Telehealth: Payer: Self-pay | Admitting: *Deleted

## 2013-10-31 MED ORDER — PANTOPRAZOLE SODIUM 40 MG PO TBEC: 40.0000 mg | DELAYED_RELEASE_TABLET | Freq: Every day | ORAL | Status: DC

## 2013-10-31 NOTE — Telephone Encounter (Signed)
I have spoken to patient. Her insurance has denied our prior authorization request for Nexium. She has to have tried omeprazole 40 mg, lansoprazole 30 mg, pantoprazole 40 mg AND rabeprazole 20 mg before an approval for Nexium would be made. Patient will be placed on Pantoprazole 40 mg daily for now and she will call back if this does not work well. New rx sent.

## 2013-11-04 ENCOUNTER — Telehealth: Payer: Self-pay | Admitting: *Deleted

## 2014-01-07 ENCOUNTER — Other Ambulatory Visit: Payer: Self-pay | Admitting: *Deleted

## 2014-01-07 MED ORDER — PANTOPRAZOLE SODIUM 40 MG PO TBEC: 40.0000 mg | DELAYED_RELEASE_TABLET | Freq: Every day | ORAL | Status: AC

## 2014-02-06 IMAGING — MG MM SCREEN MAMMOGRAM BILATERAL
4 series · 4 of 4 positions shown · non-contrast
Comparison: Previous exam(s).

CLINICAL DATA: Screening. Three prior left benign stereotactic
biopsies in 4449.

EXAM:
DIGITAL SCREENING BILATERAL MAMMOGRAM WITH CAD

[R CC]
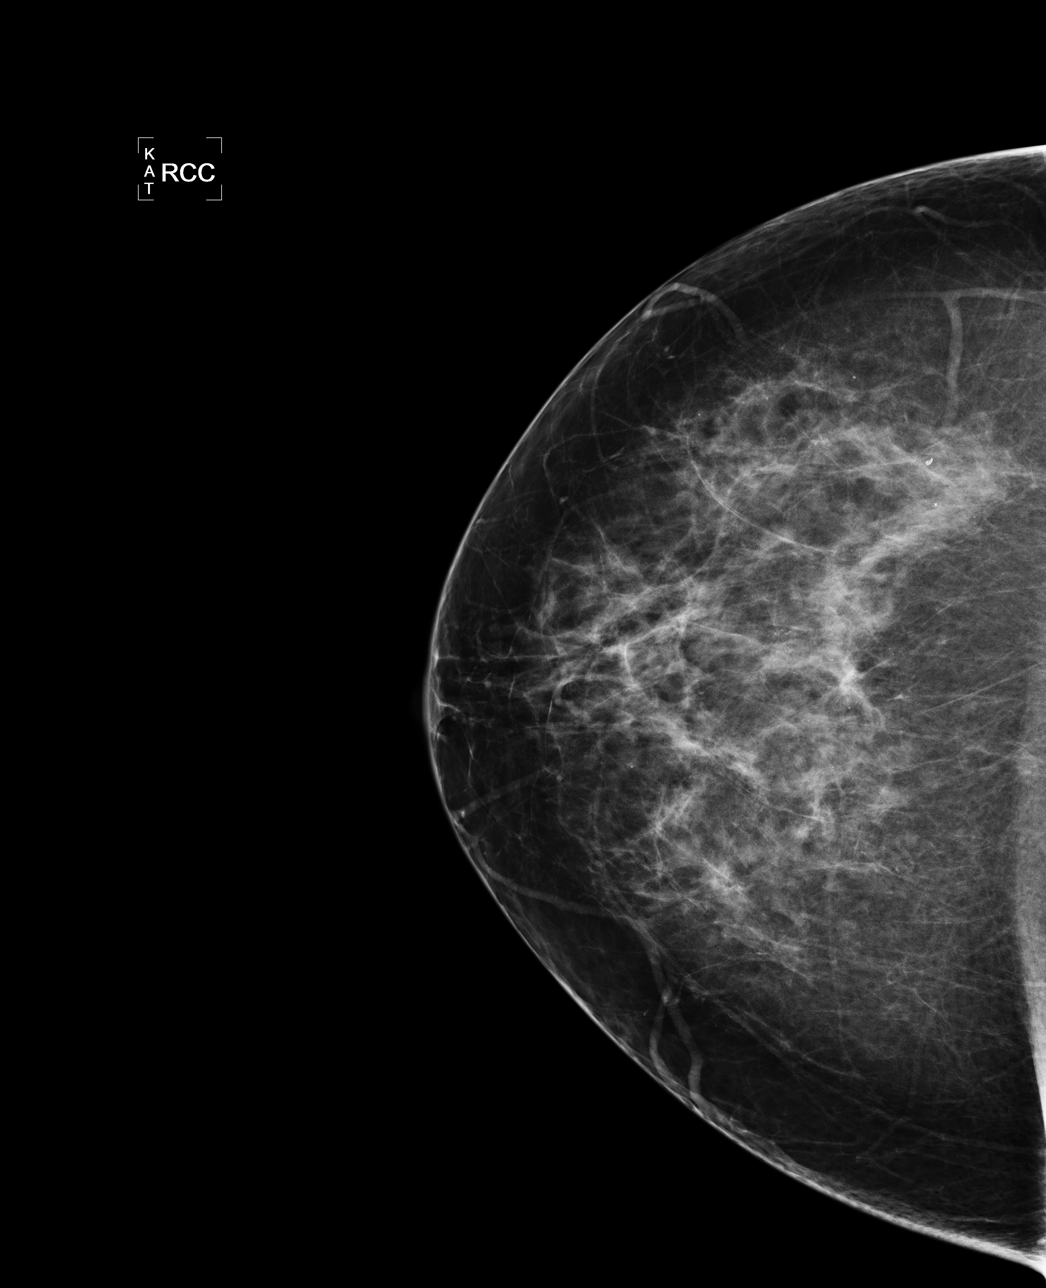

[L CC]
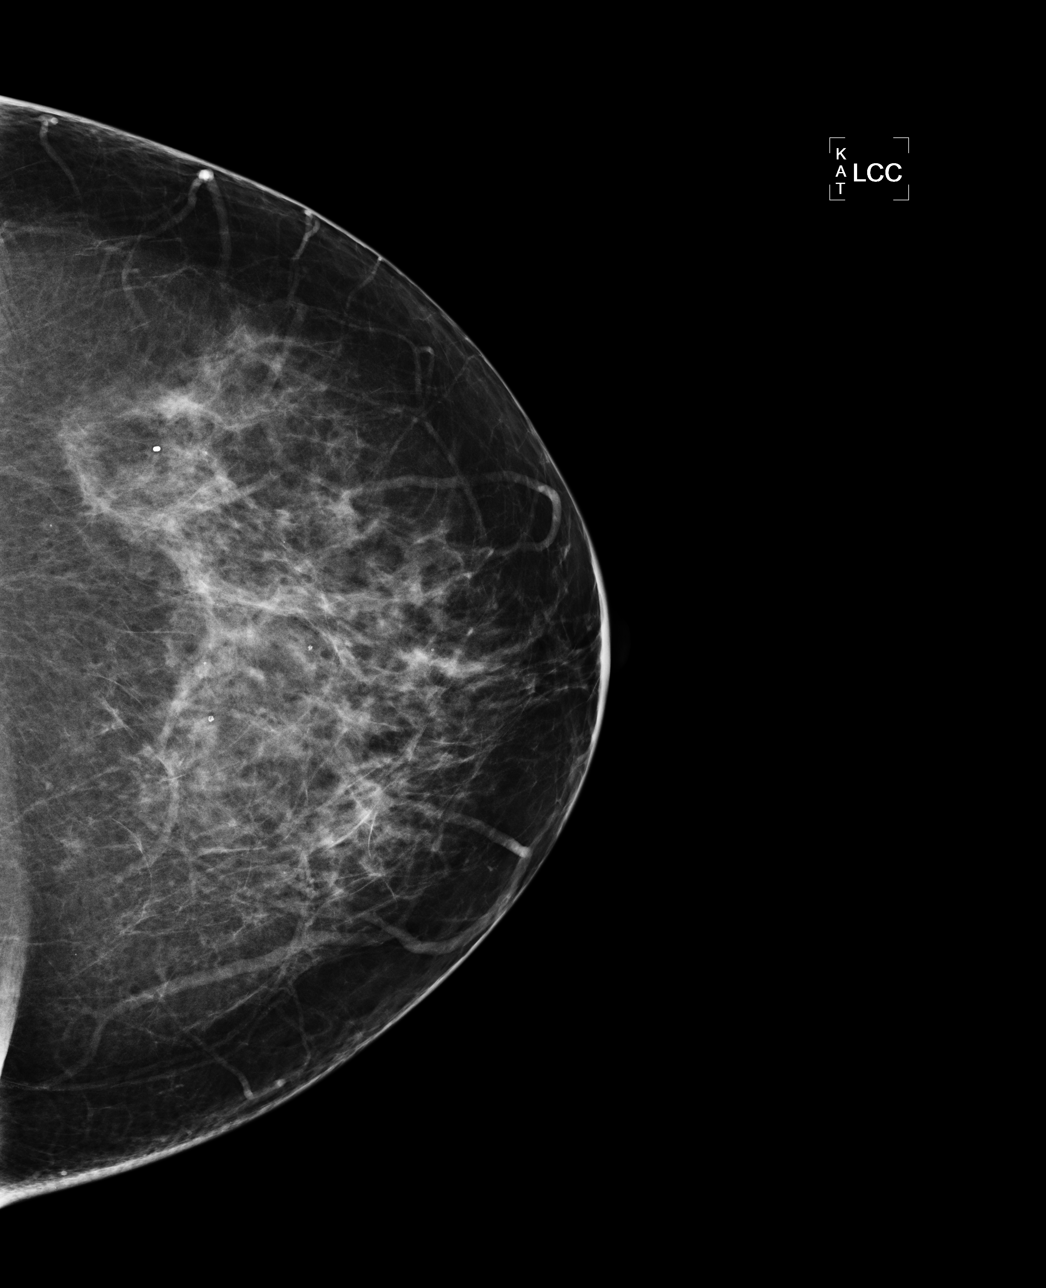

[L MLO]
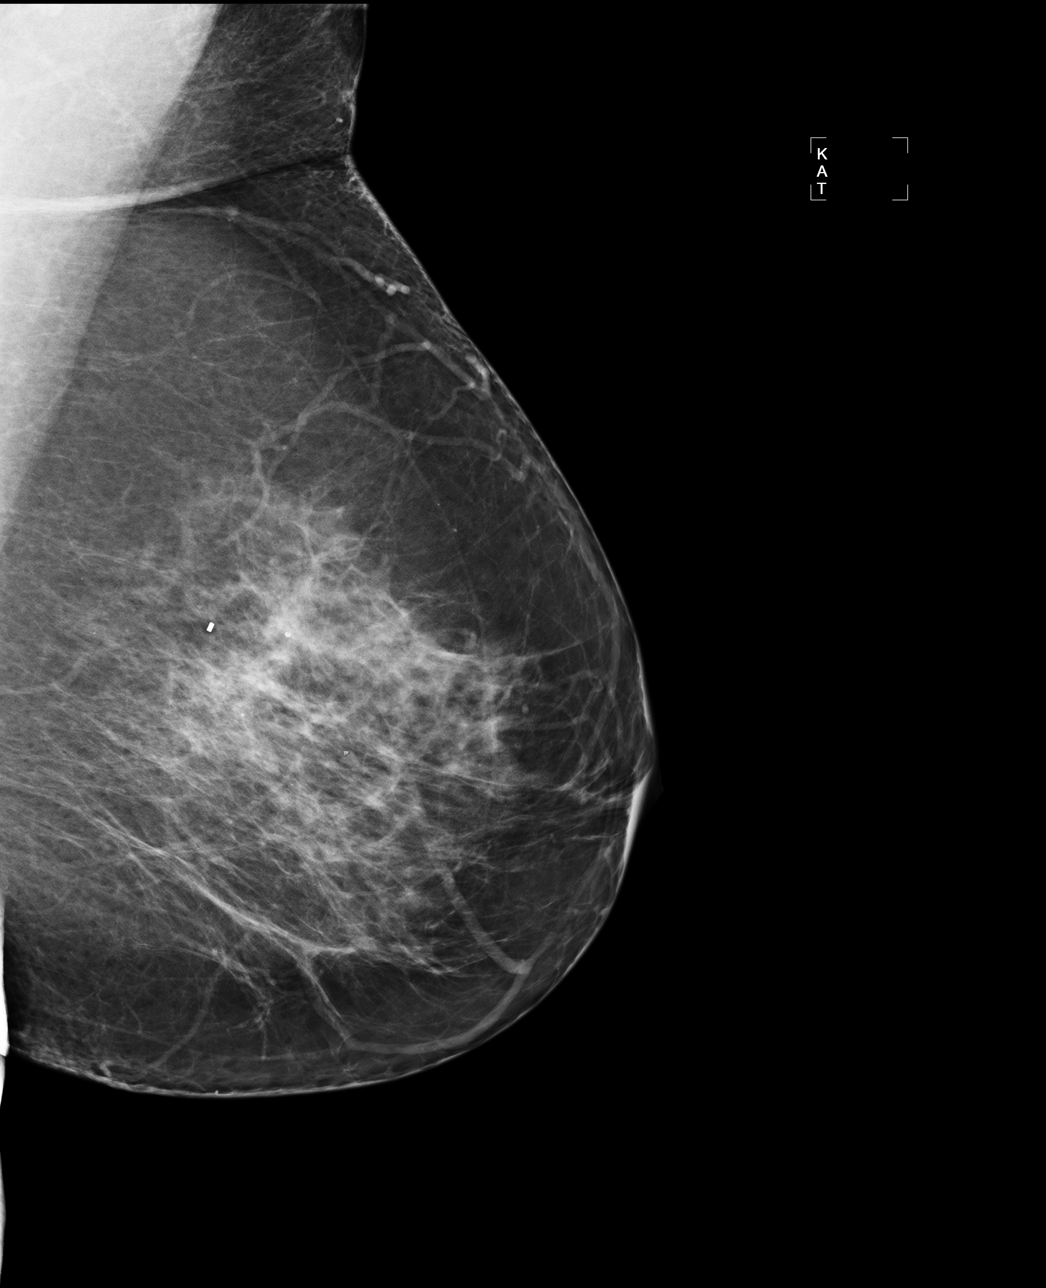

[R MLO]
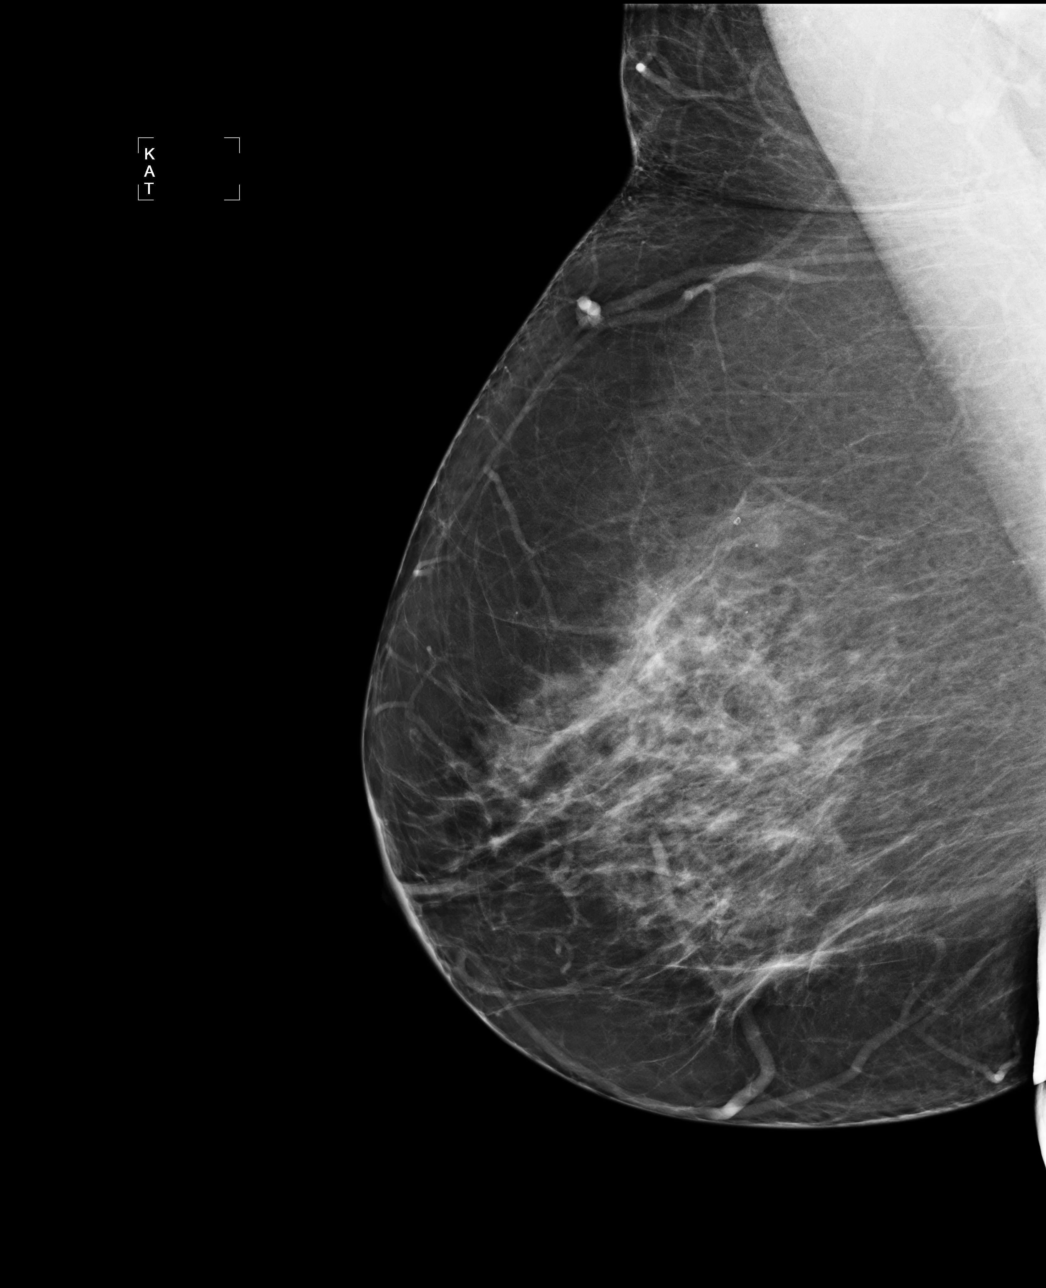

[4 of 4 positions shown; findings below may reference images not displayed]

ACR Breast Density Category c: The breasts are heterogeneously
dense, which may obscure small masses.
FINDINGS: There are no findings suspicious for malignancy. Images were
processed with CAD.
IMPRESSION: No mammographic evidence of malignancy. A result letter of this
screening mammogram will be mailed directly to the patient.

RECOMMENDATION:
Screening mammogram in one year. (Code:W7-Q-PWO)

BI-RADS CATEGORY  1: Negative

## 2014-09-10 ENCOUNTER — Other Ambulatory Visit: Payer: Self-pay

## 2014-09-10 DIAGNOSIS — Z1231 Encounter for screening mammogram for malignant neoplasm of breast: Secondary | ICD-10-CM

## 2014-09-30 ENCOUNTER — Ambulatory Visit: Payer: BC Managed Care – PPO

## 2014-10-08 ENCOUNTER — Ambulatory Visit
Admission: RE | Admit: 2014-10-08 | Discharge: 2014-10-08 | Disposition: A | Discharge status: Home / Self Care | Payer: BC Managed Care – PPO | Source: Ambulatory Visit

## 2014-10-08 DIAGNOSIS — Z1231 Encounter for screening mammogram for malignant neoplasm of breast: Secondary | ICD-10-CM

## 2015-06-22 ENCOUNTER — Encounter: Payer: Self-pay | Admitting: Internal Medicine

## 2015-11-25 ENCOUNTER — Other Ambulatory Visit: Payer: Self-pay

## 2015-11-25 DIAGNOSIS — Z1231 Encounter for screening mammogram for malignant neoplasm of breast: Secondary | ICD-10-CM

## 2015-12-28 ENCOUNTER — Ambulatory Visit
Admission: RE | Admit: 2015-12-28 | Discharge: 2015-12-28 | Disposition: A | Discharge status: Home / Self Care | Payer: PRIVATE HEALTH INSURANCE | Source: Ambulatory Visit

## 2015-12-28 DIAGNOSIS — Z1231 Encounter for screening mammogram for malignant neoplasm of breast: Secondary | ICD-10-CM

## 2016-12-09 ENCOUNTER — Other Ambulatory Visit: Payer: Self-pay | Admitting: Physician Assistant

## 2016-12-09 DIAGNOSIS — Z1231 Encounter for screening mammogram for malignant neoplasm of breast: Secondary | ICD-10-CM

## 2016-12-29 ENCOUNTER — Ambulatory Visit: Payer: PRIVATE HEALTH INSURANCE

## 2017-01-20 ENCOUNTER — Ambulatory Visit
Admission: RE | Admit: 2017-01-20 | Discharge: 2017-01-20 | Disposition: A | Discharge status: Home / Self Care | Payer: PRIVATE HEALTH INSURANCE | Source: Ambulatory Visit | Attending: Physician Assistant | Admitting: Physician Assistant

## 2017-01-20 DIAGNOSIS — Z1231 Encounter for screening mammogram for malignant neoplasm of breast: Secondary | ICD-10-CM

## 2017-12-21 ENCOUNTER — Encounter: Payer: Self-pay | Admitting: Emergency Medicine

## 2017-12-21 ENCOUNTER — Emergency Department: Payer: BLUE CROSS/BLUE SHIELD

## 2017-12-21 ENCOUNTER — Other Ambulatory Visit: Payer: Self-pay

## 2017-12-21 ENCOUNTER — Emergency Department
Admission: EM | Admit: 2017-12-21 | Discharge: 2017-12-21 | Disposition: A | Discharge status: Home / Self Care | Payer: BLUE CROSS/BLUE SHIELD | Attending: Emergency Medicine | Admitting: Emergency Medicine

## 2017-12-21 DIAGNOSIS — F329 Major depressive disorder, single episode, unspecified: Secondary | ICD-10-CM | POA: Diagnosis not present

## 2017-12-21 DIAGNOSIS — W010XXA Fall on same level from slipping, tripping and stumbling without subsequent striking against object, initial encounter: Secondary | ICD-10-CM | POA: Diagnosis not present

## 2017-12-21 DIAGNOSIS — Y929 Unspecified place or not applicable: Secondary | ICD-10-CM | POA: Diagnosis not present

## 2017-12-21 DIAGNOSIS — Z87891 Personal history of nicotine dependence: Secondary | ICD-10-CM | POA: Diagnosis not present

## 2017-12-21 DIAGNOSIS — I10 Essential (primary) hypertension: Secondary | ICD-10-CM | POA: Diagnosis not present

## 2017-12-21 DIAGNOSIS — S52501A Unspecified fracture of the lower end of right radius, initial encounter for closed fracture: Secondary | ICD-10-CM | POA: Diagnosis not present

## 2017-12-21 DIAGNOSIS — J45909 Unspecified asthma, uncomplicated: Secondary | ICD-10-CM | POA: Insufficient documentation

## 2017-12-21 DIAGNOSIS — Y9389 Activity, other specified: Secondary | ICD-10-CM | POA: Diagnosis not present

## 2017-12-21 DIAGNOSIS — Y998 Other external cause status: Secondary | ICD-10-CM | POA: Diagnosis not present

## 2017-12-21 DIAGNOSIS — Z79899 Other long term (current) drug therapy: Secondary | ICD-10-CM | POA: Diagnosis not present

## 2017-12-21 DIAGNOSIS — S6991XA Unspecified injury of right wrist, hand and finger(s), initial encounter: Secondary | ICD-10-CM | POA: Diagnosis present

## 2017-12-21 MED ORDER — OXYCODONE-ACETAMINOPHEN 5-325 MG PO TABS
1.0000 | ORAL_TABLET | Freq: Once | ORAL | Status: AC
  Administered 2017-12-21: 1 via ORAL
  Filled 2017-12-21: qty 1

## 2017-12-21 MED ORDER — BUPIVACAINE HCL 0.5 % IJ SOLN: 50.0000 mL | Freq: Once | INTRAMUSCULAR | Status: DC

## 2017-12-21 MED ORDER — BUPIVACAINE HCL (PF) 0.5 % IJ SOLN
INTRAMUSCULAR | Status: AC
  Filled 2017-12-21: qty 30

## 2017-12-21 MED ORDER — ONDANSETRON 4 MG PO TBDP
4.0000 mg | ORAL_TABLET | Freq: Once | ORAL | Status: AC
  Administered 2017-12-21: 4 mg via ORAL
  Filled 2017-12-21: qty 1

## 2017-12-21 MED ORDER — OXYCODONE-ACETAMINOPHEN 5-325 MG PO TABS: 1.0000 | ORAL_TABLET | Freq: Four times a day (QID) | ORAL | 0 refills | Status: AC | PRN

## 2017-12-21 MED ORDER — LIDOCAINE HCL (PF) 1 % IJ SOLN
5.0000 mL | Freq: Once | INTRAMUSCULAR | Status: AC
  Administered 2017-12-21: 5 mL via INTRADERMAL
  Filled 2017-12-21: qty 5

## 2017-12-21 MED ORDER — IBUPROFEN 600 MG PO TABS: 600.0000 mg | ORAL_TABLET | Freq: Four times a day (QID) | ORAL | 0 refills | Status: DC | PRN

## 2017-12-21 MED ORDER — LIDOCAINE HCL (PF) 1 % IJ SOLN
INTRAMUSCULAR | Status: AC
  Filled 2017-12-21: qty 5

## 2017-12-21 NOTE — ED Triage Notes (Signed)
Patient ambulatory to triage with steady gait, without difficulty or distress noted; pt reports tripping on steps, fell, catching self with right hand, injuring wrist; denies any other c/o or injuries

## 2017-12-21 NOTE — ED Provider Notes (Signed)
[facility] Emergency Department Provider Note  ____________________________________________  Time seen: Approximately 8:00 PM  I have reviewed the triage vital signs and the nursing notes.   HISTORY  Chief Complaint Wrist Pain    HPI Jordan Christensen is a 57 y.o. female that presents to the emergency department for evaluation of right wrist pain after fall tonight. Patient was moving furniture when she fell backwards, landing on her hand.  No additional injuries.  No numbness, tingling.    Past Medical History:  Diagnosis Date  . Asthma   . Chronic pelvic pain in female   . Chronic sinusitis   . Depression   . Endometrioma    hx  . Fecal incontinence   . GERD (gastroesophageal reflux disease)   . Headache   . Hyperlipidemia   . Hypertension   . Insomnia   . Obesity   . Scab    chronic scab on back throat    Patient Active Problem List   Diagnosis Date Noted  . HYPERCHOLESTEROLEMIA 05/08/2008  . OBESITY, UNSPECIFIED 05/08/2008  . ANXIETY 05/08/2008  . DEPRESSION 05/08/2008  . CARPAL TUNNEL SYNDROME 05/08/2008  . HYPERTENSION 05/08/2008  . SINUSITIS, CHRONIC 05/08/2008  . ASTHMA 05/08/2008  . GERD 05/08/2008  . ENDOMETRIOSIS 05/08/2008  . ACHILLES TENDINITIS 05/08/2008  . PLANTAR FASCIITIS 05/08/2008  . INSOMNIA UNSPECIFIED 05/08/2008  . HOARSENESS 05/08/2008  . NAUSEA 05/08/2008  . INCONTINENCE, FECAL 05/08/2008  . PELVIC PAIN, CHRONIC 05/08/2008  . PREDIABETES 05/08/2008    Past Surgical History:  Procedure Laterality Date  . anal sphincter repair  1999   internal and external  . APPENDECTOMY    . BURCH PROCEDURE     and culdoplasty for stress urinary incontinence  . CARPAL TUNNEL RELEASE     right and left  . LEFT OOPHORECTOMY    . MM BREAST STEREO BIOPSY LEFT ([facility] HX) Left 09/24/2008  . TOTAL ABDOMINAL HYSTERECTOMY  1994  . UPPER GASTROINTESTINAL ENDOSCOPY      Prior to Admission medications   Medication Sig Start  Date End Date Taking? Authorizing Provider  Albuterol Sulfate (PROAIR HFA IN) Inhale into the lungs as needed.    [provider]  azelastine (ASTELIN) 137 MCG/SPRAY nasal spray Place 1 spray into the nose daily. Use in each nostril as directed    [provider]  B Complex-C (SUPER B COMPLEX PO) Take by mouth daily.    [provider]  cetirizine (ZYRTEC) 10 MG tablet Take 10 mg by mouth daily.      [provider]  cholecalciferol (VITAMIN D) 1000 UNITS tablet Take 1,000 Units by mouth daily.    [provider]  DULoxetine (CYMBALTA) 60 MG capsule Take 60 mg by mouth daily.    [provider]  Flaxseed, Linseed, (BL FLAX SEED OIL PO) Take 1 capsule by mouth as directed.    [provider]  fluticasone (FLONASE) 50 MCG/ACT nasal spray Place 2 sprays into the nose daily.      [provider]  ibuprofen (ADVIL,MOTRIN) 600 MG tablet Take 1 tablet (600 mg total) by mouth every 6 (six) hours as needed. 12/21/17   Laban Emperor, PA-C  Multiple Vitamin (MULTIVITAMIN) tablet Take 1 tablet by mouth daily.      [provider]  NATURAL PSYLLIUM FIBER PO Take 1 capsule by mouth daily.    [provider]  NON FORMULARY [place] Chelated Magnesium 133 mg daily    [provider]  Omega-3  Fatty Acids (FISH OIL) 1000 MG CAPS Take 1 capsule by mouth.     [provider]  oxyCODONE-acetaminophen (ROXICET) 5-325 MG tablet Take 1 tablet by mouth every 6 (six) hours as needed for up to 3 days. 12/21/17 12/24/17  Laban Emperor, PA-C  pantoprazole (PROTONIX) 40 MG tablet Take 1 tablet (40 mg total) by mouth daily. 01/07/14   [place] Dragon, MD  pravastatin (PRAVACHOL) 20 MG tablet Take 20 mg by mouth daily.      [provider]  topiramate (TOPAMAX) 50 MG tablet Take 50 mg by mouth daily.    [provider]    Allergies Sulfonamide derivatives  Family History  Problem Relation Age of Onset  .  Diabetes Mother   . Heart disease Mother   . Liver cancer Father   . Heart disease Brother   . Breast cancer Maternal Aunt   . Colon cancer Neg Hx   . Esophageal cancer Neg Hx   . Rectal cancer Neg Hx   . Stomach cancer Neg Hx     Social History Social History   Tobacco Use  . Smoking status: Former Smoker    Types: Cigarettes    Last attempt to quit: 08/20/1988    Years since quitting: 29.3  . Smokeless tobacco: Never Used  Substance Use Topics  . Alcohol use: Yes    Alcohol/week: 1.2 oz    Types: 2 Cans of beer per week  . Drug use: No     Review of Systems  Cardiovascular: No chest pain. Respiratory:No SOB. Gastrointestinal: No abdominal pain.  No nausea, no vomiting.  Musculoskeletal: Positive for wrist pain. Skin: Negative for rash, abrasions, lacerations, ecchymosis. Neurological: Negative for numbness or tingling   ____________________________________________   PHYSICAL EXAM:  VITAL SIGNS: ED Triage Vitals  Enc Vitals Group     BP 12/21/17 1947 (!) 160/90     Pulse Rate 12/21/17 1947 88     Resp 12/21/17 1947 20     Temp 12/21/17 1947 97.9 F (36.6 C)     Temp Source 12/21/17 1947 Oral     SpO2 12/21/17 1947 98 %     Weight 12/21/17 1945 220 lb (99.8 kg)     Height 12/21/17 1945 5\' 7"  (1.702 m)     Head Circumference --      Peak Flow --      Pain Score 12/21/17 1945 6     Pain Loc --      Pain Edu? --      Excl. in [place]? --      Constitutional: Alert and oriented. Well appearing and in no acute distress. Eyes: Conjunctivae are normal. PERRL. EOMI. Head: Atraumatic. ENT:      Ears:      Nose: No congestion/rhinnorhea.      Mouth/Throat: Mucous membranes are moist.  Neck: No stridor. Cardiovascular: Normal rate, regular rhythm.  Good peripheral circulation. Symmetric radial pulses bilaterally.  Respiratory: Normal respiratory effort without tachypnea or retractions. Lungs CTAB. Good air entry to the bases with no decreased or absent breath  sounds. Musculoskeletal: No gross deformities appreciated. Swelling and tenderness to palpation over radial side of wrist. Limited ROM of wrist due to pain. Grip strength intact.  Neurologic:  Normal speech and language. No gross focal neurologic deficits are appreciated.  Skin:  Skin is warm, dry and intact. No rash noted.   ____________________________________________   LABS (all labs ordered are listed, but only abnormal results are displayed)  Labs  Reviewed - No data to display ____________________________________________  EKG   ____________________________________________  RADIOLOGY Robinette Haines, personally viewed and evaluated these images (plain radiographs) as part of my medical decision making, as well as reviewing the written report by the radiologist.  Dg Wrist Complete Right  Result Date: 12/21/2017 CLINICAL DATA:  Post reduction EXAM: RIGHT WRIST - COMPLETE 3+ VIEW COMPARISON:  Same day pre reduction radiographs FINDINGS: New volar splint is noted. Comminuted fracture of the distal radial metaphysis is redemonstrated, stable in appearance without significant change as seen through fiberglass. IMPRESSION: Stable mildly comminuted fracture of the distal radial metaphysis with slight dorsal displacement as seen through fiberglass splint. Electronically Signed   By: Deara Bober Royalty M.D.   On: 12/21/2017 23:15   Dg Wrist Complete Right  Result Date: 12/21/2017 CLINICAL DATA:  Patient tripped on steps and fell.  Wrist pain. EXAM: RIGHT WRIST - COMPLETE 3+ VIEW COMPARISON:  None. FINDINGS: Comminuted fracture of the distal radial metaphysis identified with no definite involvement of the articular surface. Slight posterior displacement of the distal fragment evident on the lateral film. No associated fracture of the distal ulna evident. IMPRESSION: Mildly comminuted fracture of the distal radial metaphysis, as above. Electronically Signed   By: Misty Stanley M.D.   On: 12/21/2017  20:12    ____________________________________________    PROCEDURES  Procedure(s) performed:    Procedures  .  Reduction  Date/Time: 12:44 AM Performed by: Laban Emperor Authorized by: Laban Emperor Consent: Verbal consent obtained. Risks and benefits: risks, benefits and alternatives were discussed Consent given by: patient   Anesthetic: 50-50 mixture of lidocaine and bupivacaine.  Hematoma block was used to reduce pain since follow-up would be greater than 48 hours Patient tolerance: Patient tolerated the procedure well with no immediate complications. Location: Distal radius Reduction technique: Finger traps were used to provide traction. Wrist was hyperextended followed by volar translation of the distal radial fragment. Counter traction was provided by additional PA.    Medications  bupivacaine (MARCAINE) 0.5 % (with pres) injection 50 mL (not administered)  oxyCODONE-acetaminophen (PERCOCET/ROXICET) 5-325 MG per tablet 1 tablet (1 tablet Oral Given 12/21/17 2026)  ondansetron (ZOFRAN-ODT) disintegrating tablet 4 mg (4 mg Oral Given 12/21/17 2026)  lidocaine (PF) (XYLOCAINE) 1 % injection 5 mL (5 mLs Intradermal Given 12/21/17 2130)  oxyCODONE-acetaminophen (PERCOCET/ROXICET) 5-325 MG per tablet 1 tablet (1 tablet Oral Given 12/21/17 2341)     ____________________________________________   INITIAL IMPRESSION / ASSESSMENT AND PLAN / ED COURSE  Pertinent labs & imaging results that were available during my care of the patient were reviewed by me and considered in my medical decision making (see chart for details).  Review of the [place] CSRS was performed in accordance of the [place] prior to dispensing any controlled drugs.     Patient presented to the emergency department for evaluation of distal comminuted radial fracture.  Vital signs and exam are reassuring.  X-ray consistent with fracture.  Fracture was reduced in ED.  Splint was applied.  Cap refill less than 3  seconds after splint placement.  Patient will be discharged home with prescriptions for Percocet. Patient is to follow up with orthopedics as directed. Patient is given ED precautions to return to the ED for any worsening or new symptoms.     ____________________________________________  FINAL CLINICAL IMPRESSION(S) / ED DIAGNOSES  Final diagnoses:  Closed fracture of distal end of right radius, unspecified fracture morphology, initial encounter      NEW MEDICATIONS STARTED DURING  THIS VISIT:  ED Discharge Orders        Ordered    oxyCODONE-acetaminophen (ROXICET) 5-325 MG tablet  Every 6 hours PRN     12/21/17 2335    ibuprofen (ADVIL,MOTRIN) 600 MG tablet  Every 6 hours PRN     12/21/17 2335          This chart was dictated using voice recognition software/Dragon. Despite best efforts to proofread, errors can occur which can change the meaning. Any change was purely unintentional.    Laban Emperor, PA-C 12/22/17 0050    Arta Silence, MD 12/24/17 [phone]

## 2018-08-08 DIAGNOSIS — E119 Type 2 diabetes mellitus without complications: Secondary | ICD-10-CM | POA: Diagnosis not present

## 2018-08-08 DIAGNOSIS — E7800 Pure hypercholesterolemia, unspecified: Secondary | ICD-10-CM | POA: Diagnosis not present

## 2018-08-08 DIAGNOSIS — I10 Essential (primary) hypertension: Secondary | ICD-10-CM | POA: Diagnosis not present

## 2018-12-14 DIAGNOSIS — I10 Essential (primary) hypertension: Secondary | ICD-10-CM | POA: Diagnosis not present

## 2018-12-14 DIAGNOSIS — E119 Type 2 diabetes mellitus without complications: Secondary | ICD-10-CM | POA: Diagnosis not present

## 2018-12-14 DIAGNOSIS — E7800 Pure hypercholesterolemia, unspecified: Secondary | ICD-10-CM | POA: Diagnosis not present

## 2019-01-04 DIAGNOSIS — Z6839 Body mass index (BMI) 39.0-39.9, adult: Secondary | ICD-10-CM | POA: Diagnosis not present

## 2019-01-04 DIAGNOSIS — J45901 Unspecified asthma with (acute) exacerbation: Secondary | ICD-10-CM | POA: Diagnosis not present

## 2019-01-04 DIAGNOSIS — J111 Influenza due to unidentified influenza virus with other respiratory manifestations: Secondary | ICD-10-CM | POA: Diagnosis not present

## 2019-02-14 DIAGNOSIS — Z01411 Encounter for gynecological examination (general) (routine) with abnormal findings: Secondary | ICD-10-CM | POA: Diagnosis not present

## 2019-02-14 DIAGNOSIS — Z1231 Encounter for screening mammogram for malignant neoplasm of breast: Secondary | ICD-10-CM | POA: Diagnosis not present

## 2019-02-14 DIAGNOSIS — Z780 Asymptomatic menopausal state: Secondary | ICD-10-CM | POA: Diagnosis not present

## 2019-02-14 DIAGNOSIS — Z79890 Hormone replacement therapy (postmenopausal): Secondary | ICD-10-CM | POA: Diagnosis not present

## 2019-02-20 ENCOUNTER — Other Ambulatory Visit: Payer: Self-pay | Admitting: Gynecology

## 2019-02-20 DIAGNOSIS — R928 Other abnormal and inconclusive findings on diagnostic imaging of breast: Secondary | ICD-10-CM

## 2019-02-22 ENCOUNTER — Ambulatory Visit: Admission: RE | Admit: 2019-02-22 | Payer: BLUE CROSS/BLUE SHIELD | Source: Ambulatory Visit

## 2019-02-22 ENCOUNTER — Other Ambulatory Visit: Payer: Self-pay

## 2019-02-22 ENCOUNTER — Ambulatory Visit
Admission: RE | Admit: 2019-02-22 | Discharge: 2019-02-22 | Disposition: A | Discharge status: Home / Self Care | Payer: 59 | Source: Ambulatory Visit | Attending: Gynecology | Admitting: Gynecology

## 2019-02-22 DIAGNOSIS — R928 Other abnormal and inconclusive findings on diagnostic imaging of breast: Secondary | ICD-10-CM

## 2019-02-25 DIAGNOSIS — Z6839 Body mass index (BMI) 39.0-39.9, adult: Secondary | ICD-10-CM | POA: Diagnosis not present

## 2019-02-25 DIAGNOSIS — I10 Essential (primary) hypertension: Secondary | ICD-10-CM | POA: Diagnosis not present

## 2019-02-25 DIAGNOSIS — E119 Type 2 diabetes mellitus without complications: Secondary | ICD-10-CM | POA: Diagnosis not present

## 2019-04-16 DIAGNOSIS — E1165 Type 2 diabetes mellitus with hyperglycemia: Secondary | ICD-10-CM | POA: Diagnosis not present

## 2019-04-16 DIAGNOSIS — E7800 Pure hypercholesterolemia, unspecified: Secondary | ICD-10-CM | POA: Diagnosis not present

## 2019-04-16 DIAGNOSIS — I10 Essential (primary) hypertension: Secondary | ICD-10-CM | POA: Diagnosis not present

## 2019-04-18 DIAGNOSIS — L820 Inflamed seborrheic keratosis: Secondary | ICD-10-CM | POA: Diagnosis not present

## 2019-04-18 DIAGNOSIS — L821 Other seborrheic keratosis: Secondary | ICD-10-CM | POA: Diagnosis not present

## 2019-08-07 DIAGNOSIS — E119 Type 2 diabetes mellitus without complications: Secondary | ICD-10-CM | POA: Insufficient documentation

## 2019-09-04 DIAGNOSIS — R000 Tachycardia, unspecified: Secondary | ICD-10-CM | POA: Insufficient documentation

## 2021-09-28 ENCOUNTER — Other Ambulatory Visit: Payer: Self-pay | Admitting: Physician Assistant

## 2021-09-28 DIAGNOSIS — Z1231 Encounter for screening mammogram for malignant neoplasm of breast: Secondary | ICD-10-CM

## 2021-10-26 ENCOUNTER — Ambulatory Visit: Payer: 59

## 2021-12-08 ENCOUNTER — Ambulatory Visit
Admission: RE | Admit: 2021-12-08 | Discharge: 2021-12-08 | Disposition: A | Discharge status: Home / Self Care | Payer: 59 | Source: Ambulatory Visit | Attending: Physician Assistant | Admitting: Physician Assistant

## 2021-12-08 ENCOUNTER — Other Ambulatory Visit: Payer: Self-pay

## 2021-12-08 DIAGNOSIS — Z1231 Encounter for screening mammogram for malignant neoplasm of breast: Secondary | ICD-10-CM

## 2021-12-12 HISTORY — PX: KNEE SURGERY: SHX244

## 2022-12-23 ENCOUNTER — Other Ambulatory Visit: Payer: Self-pay | Admitting: Physician Assistant

## 2022-12-23 DIAGNOSIS — Z1231 Encounter for screening mammogram for malignant neoplasm of breast: Secondary | ICD-10-CM

## 2023-02-13 ENCOUNTER — Ambulatory Visit
Admission: RE | Admit: 2023-02-13 | Discharge: 2023-02-13 | Disposition: A | Discharge status: Home / Self Care | Payer: 59 | Source: Ambulatory Visit | Attending: Physician Assistant | Admitting: Physician Assistant

## 2023-02-13 DIAGNOSIS — Z1231 Encounter for screening mammogram for malignant neoplasm of breast: Secondary | ICD-10-CM

## 2023-03-07 ENCOUNTER — Encounter: Payer: Self-pay | Admitting: Orthopaedic Surgery

## 2023-03-07 ENCOUNTER — Other Ambulatory Visit (INDEPENDENT_AMBULATORY_CARE_PROVIDER_SITE_OTHER): Payer: 59

## 2023-03-07 ENCOUNTER — Ambulatory Visit: Payer: 59 | Admitting: Orthopaedic Surgery

## 2023-03-07 VITALS — Ht 66.0 in | Wt 206.6 lb

## 2023-03-07 DIAGNOSIS — M7062 Trochanteric bursitis, left hip: Secondary | ICD-10-CM

## 2023-03-07 DIAGNOSIS — M25552 Pain in left hip: Secondary | ICD-10-CM

## 2023-03-07 DIAGNOSIS — M25551 Pain in right hip: Secondary | ICD-10-CM

## 2023-03-07 MED ORDER — LIDOCAINE HCL 1 % IJ SOLN
3.0000 mL | INTRAMUSCULAR | Status: AC | PRN
  Administered 2023-03-07: 3 mL

## 2023-03-07 MED ORDER — METHYLPREDNISOLONE ACETATE 40 MG/ML IJ SUSP
40.0000 mg | INTRAMUSCULAR | Status: AC | PRN
  Administered 2023-03-07: 40 mg via INTRA_ARTICULAR

## 2023-03-07 NOTE — Progress Notes (Signed)
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  She has not had any type of steroid use and denies any groin pain.  She denies any injury.  She does not drink excessive alcohol.  She says her right hip does not hurt and the left hip only hurts on the lateral aspect of her left hip and not in the groin on either side at all.  On exam she walks with no limp.  Her leg lengths are equal.  She has full and fluid range of motion of both hips with no pain in the groin at all.  Her pain is only to palpation over the proximal IT band of her left hip.  An AP pelvis and lateral both hips shows normal-appearing hips bilaterally.  Both hips move smoothly and fluidly with no pain in the groin at all.  She does not walk with any limp at all.  Her leg lengths are equal.  There is only some mild to moderate pain to palpation over the proximal IT band.  I did recommend a steroid injection over the point of maximal tenderness and she agreed to this and tolerated it well.  I would like to see her back in 4 weeks.  She has been dealing with pain for about a year now and said she was still would not of even come to see me if it was not for the incidental finding of the CT scan of her bladder suggesting there is some AVN.  I would still consider MRI of her hip if she continues to have any problems but we will see her back in 4 weeks to see how she is doing overall.  I did show her stretching exercises that I want her to try twice daily as well as topical Voltaren gel.     Procedure Note  Patient: Jordan Christensen             Date of Birth: 28-May-1961           MRN: [id number]             Visit Date: 03/07/2023  Procedures: Visit Diagnoses:  1. Pain of left hip   2. Pain of right hip   3. Trochanteric bursitis, left hip     Large Joint Inj: L greater trochanter on 03/07/2023 8:58 AM Indications: pain and diagnostic  evaluation Details: 22 G 1.5 in needle, lateral approach  Arthrogram: No  Medications: 3 mL lidocaine 1 %; 40 mg methylPREDNISolone acetate 40 MG/ML Outcome: tolerated well, no immediate complications Procedure, treatment alternatives, risks and benefits explained, specific risks discussed. Consent was given by the patient. Immediately prior to procedure a time out was called to verify the correct patient, procedure, equipment, support staff and site/side marked as required. Patient was prepped and draped in the usual sterile fashion.

## 2023-04-05 ENCOUNTER — Ambulatory Visit: Payer: 59 | Admitting: Orthopaedic Surgery

## 2023-08-21 ENCOUNTER — Encounter: Payer: Self-pay | Admitting: Physician Assistant

## 2023-08-21 ENCOUNTER — Other Ambulatory Visit (INDEPENDENT_AMBULATORY_CARE_PROVIDER_SITE_OTHER): Payer: 59

## 2023-08-21 ENCOUNTER — Other Ambulatory Visit: Payer: Self-pay

## 2023-08-21 ENCOUNTER — Ambulatory Visit (INDEPENDENT_AMBULATORY_CARE_PROVIDER_SITE_OTHER): Payer: 59 | Admitting: Physician Assistant

## 2023-08-21 DIAGNOSIS — M7062 Trochanteric bursitis, left hip: Secondary | ICD-10-CM | POA: Diagnosis not present

## 2023-08-21 DIAGNOSIS — M5416 Radiculopathy, lumbar region: Secondary | ICD-10-CM

## 2023-08-21 MED ORDER — METHYLPREDNISOLONE 4 MG PO TABS: ORAL_TABLET | ORAL | 0 refills | Status: DC

## 2023-08-21 MED ORDER — GABAPENTIN 300 MG PO CAPS: 300.0000 mg | ORAL_CAPSULE | Freq: Every day | ORAL | 1 refills | Status: DC

## 2023-08-21 NOTE — Progress Notes (Signed)
Office Visit Note   Patient: Jordan Christensen           Date of Birth: 28-Mar-1961           MRN: [id number] Visit Date: 08/21/2023              Requested by: Lonie Peak, PA-C [address] [place],  [place] [address] PCP: Lonie Peak, PA-C   Assessment & Plan: Visit Diagnoses:  1. Trochanteric bursitis, left hip   2. Radiculopathy, lumbar region     Plan: She has been released on a Medrol Dosepak.  She is diabetic.  Reports very good control and monitors her glucose daily.  She had an Dosepak earlier this year in January and reports she had no significant increases in her glucose levels.  Will send her to formal physical therapy for core strengthening, hamstring stretching, IT band stretching back exercises, home exercise program and modalities.  Have her follow-up with [place] in 4 weeks see how she is doing overall.  Questions were encouraged and answered at length  Follow-Up Instructions: Return in about 4 weeks (around 09/18/2023).   Orders:  Orders Placed This Encounter  Procedures   XR Lumbar Spine 2-3 Views   Meds ordered this encounter  Medications   methylPREDNISolone (MEDROL) 4 MG tablet    Sig: Take as directed    Dispense:  21 tablet    Refill:  0   gabapentin (NEURONTIN) 300 MG capsule    Sig: Take 1 capsule (300 mg total) by mouth at bedtime.    Dispense:  30 capsule    Refill:  1      Procedures: No procedures performed   Clinical Data: No additional findings.   Subjective: Chief Complaint  Patient presents with   Right Hip - Pain   Left Hip - Pain    HPI Ms. Stefanko 62 year old female who was last seen by Dr. Magnus Ivan in March and underwent a left hip trochanteric injection.  States this really helped.  She comes in today requesting bilateral hip injections.  She states she has minimal pain in the right hip mainly has pain in the left lateral hip.  However she notes that over the last 2 to 3 weeks she has had tingling and burning down the  lateral aspect of the left leg to the knee.  She is also having some sharp pain down the back of both legs.  She states the only relief she gets is when lying down.  She does note back pain.  Denies any waking pain, saddle anesthesia symptoms, bowel or bladder incontinence, fevers or chills.  Reports that her glucose levels run 90-125 and her last hemoglobin A1c was 6.7. Review of Systems See HPI otherwise negative  Objective: Vital Signs: There were no vitals taken for this visit.  Physical Exam Constitutional:      Appearance: She is not ill-appearing or diaphoretic.  Cardiovascular:     Pulses: Normal pulses.  Neurological:     Mental Status: She is alert and oriented to person, place, and time.  Psychiatric:        Mood and Affect: Mood normal.     Ortho Exam Bilateral hips: Good range of motion of both hips without pain.  Tenderness over the left hip trochanteric region only. Lower extremities: 5 out of 5 strength throughout the lower extremities against resistance.  Straight leg raise is negative bilaterally.  Sensation grossly intact bilateral feet to light touch.  Full forward flexion lumbar  spine.  Decreased lumbar extension and low back pain.  Tenderness left lower lumbar sacral paraspinous region.  Specialty Comments:  No specialty comments available.  Imaging: XR Lumbar Spine 2-3 Views  Result Date: 08/21/2023 Lumbar spine 2 views: No acute fractures.  Grade 1 L4 on L5 spondylolisthesis.  Facet changes lower lumbar spine.  Normal lordotic curvature.    PMFS History: Patient Active Problem List   Diagnosis Date Noted   Tachycardia 09/04/2019   Type 2 diabetes mellitus without complication, without long-term current use of insulin (HCC) 08/07/2019   HYPERCHOLESTEROLEMIA 05/08/2008   OBESITY, UNSPECIFIED 05/08/2008   ANXIETY 05/08/2008   DEPRESSION 05/08/2008   CARPAL TUNNEL SYNDROME 05/08/2008   HYPERTENSION 05/08/2008   SINUSITIS, CHRONIC 05/08/2008   ASTHMA  05/08/2008   GERD 05/08/2008   ENDOMETRIOSIS 05/08/2008   ACHILLES TENDINITIS 05/08/2008   PLANTAR FASCIITIS 05/08/2008   INSOMNIA UNSPECIFIED 05/08/2008   HOARSENESS 05/08/2008   NAUSEA 05/08/2008   INCONTINENCE, FECAL 05/08/2008   PELVIC PAIN, CHRONIC 05/08/2008   PREDIABETES 05/08/2008   Past Medical History:  Diagnosis Date   Asthma    Chronic pelvic pain in female    Chronic sinusitis    Depression    Endometrioma    hx   Fecal incontinence    GERD (gastroesophageal reflux disease)    Headache    Hyperlipidemia    Hypertension    Insomnia    Obesity    Scab    chronic scab on back throat    Family History  Problem Relation Age of Onset   Diabetes Mother    Heart disease Mother    Liver cancer Father    Heart disease Brother    Breast cancer Maternal Aunt    Colon cancer Neg Hx    Esophageal cancer Neg Hx    Rectal cancer Neg Hx    Stomach cancer Neg Hx     Past Surgical History:  Procedure Laterality Date   anal sphincter repair  1999   internal and external   APPENDECTOMY     BREAST BIOPSY Left 2009   BURCH PROCEDURE     and culdoplasty for stress urinary incontinence   CARPAL TUNNEL RELEASE     right and left   LEFT OOPHORECTOMY     MM BREAST STEREO BIOPSY LEFT ([facility] HX) Left 09/24/2008   TOTAL ABDOMINAL HYSTERECTOMY  1994   UPPER GASTROINTESTINAL ENDOSCOPY     Social History   Occupational History   Not on file  Tobacco Use   Smoking status: Former    Current packs/day: 0.00    Types: Cigarettes    Quit date: 08/20/1988    Years since quitting: 35.0   Smokeless tobacco: Never  Substance and Sexual Activity   Alcohol use: Yes    Alcohol/week: 2.0 standard drinks of alcohol    Types: 2 Cans of beer per week   Drug use: No   Sexual activity: Not on file

## 2023-08-24 ENCOUNTER — Ambulatory Visit: Payer: 59 | Admitting: Physician Assistant

## 2023-08-30 ENCOUNTER — Encounter: Payer: Self-pay | Admitting: Physical Therapy

## 2023-08-30 ENCOUNTER — Telehealth: Payer: Self-pay | Admitting: Physician Assistant

## 2023-08-30 ENCOUNTER — Other Ambulatory Visit: Payer: Self-pay | Admitting: Orthopaedic Surgery

## 2023-08-30 ENCOUNTER — Other Ambulatory Visit: Payer: Self-pay

## 2023-08-30 ENCOUNTER — Ambulatory Visit (INDEPENDENT_AMBULATORY_CARE_PROVIDER_SITE_OTHER): Payer: 59 | Admitting: Physical Therapy

## 2023-08-30 DIAGNOSIS — R29898 Other symptoms and signs involving the musculoskeletal system: Secondary | ICD-10-CM

## 2023-08-30 DIAGNOSIS — R262 Difficulty in walking, not elsewhere classified: Secondary | ICD-10-CM

## 2023-08-30 DIAGNOSIS — M25552 Pain in left hip: Secondary | ICD-10-CM

## 2023-08-30 DIAGNOSIS — M5459 Other low back pain: Secondary | ICD-10-CM | POA: Diagnosis not present

## 2023-08-30 DIAGNOSIS — M6281 Muscle weakness (generalized): Secondary | ICD-10-CM | POA: Diagnosis not present

## 2023-08-30 MED ORDER — NABUMETONE 750 MG PO TABS: 750.0000 mg | ORAL_TABLET | Freq: Two times a day (BID) | ORAL | 1 refills | Status: AC | PRN

## 2023-08-30 NOTE — Telephone Encounter (Signed)
Pt came into office regarding her hip pain. Pt requested pain meds to relieve the pain. Pt advised to give her a call please. Jordan Christensen [phone]

## 2023-08-30 NOTE — Telephone Encounter (Signed)
Patient aware this was sent to her pharmacy for her. She is already doing PT

## 2023-08-30 NOTE — Therapy (Signed)
OUTPATIENT PHYSICAL THERAPY LOWER EXTREMITY EVALUATION   Patient Name: Jordan Christensen MRN: [id number] DOB:06/19/1961, 62 y.o., female Today's Date: 08/30/2023  END OF SESSION:  PT End of Session - 08/30/23 0847     Visit Number 1    Number of Visits 13    Date for PT Re-Evaluation 10/11/23    Authorization Type [employer]    Authorization Time Period 08/30/23 to 10/11/23    PT Start Time 0806    PT Stop Time 0844    PT Time Calculation (min) 38 min    Activity Tolerance Patient tolerated treatment well    Behavior During Therapy WFL for tasks assessed/performed             Past Medical History:  Diagnosis Date   Asthma    Chronic pelvic pain in female    Chronic sinusitis    Depression    Endometrioma    hx   Fecal incontinence    GERD (gastroesophageal reflux disease)    Headache    Hyperlipidemia    Hypertension    Insomnia    Obesity    Scab    chronic scab on back throat   Past Surgical History:  Procedure Laterality Date   anal sphincter repair  1999   internal and external   APPENDECTOMY     BREAST BIOPSY Left 2009   BURCH PROCEDURE     and culdoplasty for stress urinary incontinence   CARPAL TUNNEL RELEASE     right and left   LEFT OOPHORECTOMY     MM BREAST STEREO BIOPSY LEFT ([facility] HX) Left 09/24/2008   TOTAL ABDOMINAL HYSTERECTOMY  1994   UPPER GASTROINTESTINAL ENDOSCOPY     Patient Active Problem List   Diagnosis Date Noted   Tachycardia 09/04/2019   Type 2 diabetes mellitus without complication, without long-term current use of insulin (HCC) 08/07/2019   HYPERCHOLESTEROLEMIA 05/08/2008   OBESITY, UNSPECIFIED 05/08/2008   ANXIETY 05/08/2008   DEPRESSION 05/08/2008   CARPAL TUNNEL SYNDROME 05/08/2008   HYPERTENSION 05/08/2008   SINUSITIS, CHRONIC 05/08/2008   ASTHMA 05/08/2008   GERD 05/08/2008   ENDOMETRIOSIS 05/08/2008   ACHILLES TENDINITIS 05/08/2008   PLANTAR FASCIITIS 05/08/2008   INSOMNIA UNSPECIFIED 05/08/2008   HOARSENESS  05/08/2008   NAUSEA 05/08/2008   INCONTINENCE, FECAL 05/08/2008   PELVIC PAIN, CHRONIC 05/08/2008   PREDIABETES 05/08/2008    PCP: Lonie Peak PA-C   REFERRING PROVIDER: Kirtland Bouchard, PA-C  REFERRING DIAG: [phone] (ICD-10-CM) - Trochanteric bursitis, left hip  THERAPY DIAG:  Pain in left hip  Other low back pain  Muscle weakness (generalized)  Difficulty in walking, not elsewhere classified  Other symptoms and signs involving the musculoskeletal system  Rationale for Evaluation and Treatment: Rehabilitation  ONSET DATE: March 2024  SUBJECTIVE:   SUBJECTIVE STATEMENT: This started in March, I came in and got an injection in my hip, I was fine until about a month and a half ago, the pain just came creeping back. Its hard for me to walk due to pain- sitting isn't too bad, laying flat on my back makes it go away but walking is very painful. I can do stairs but slowly. Last time I was in the doctor told me I have a slipped disc at L4-5 and gave me gabapentin, back doesn't stop me from doing anything but when I lift even light weights the nerve pain is worse.   PERTINENT HISTORY: Asthma, chronic pelvic pain, depression, fecal incontinence, HA, HLD, HTN, insomnia, chronic  scab in throat PAIN:  Are you having pain? Yes: NPRS scale: 3/10 Pain location: hip L and B legs L >R Pain description: burning, aching, cramping  Aggravating factors: walking, standing  Relieving factors: laying down flat in supine, ice   PRECAUTIONS: None  RED FLAGS: None   WEIGHT BEARING RESTRICTIONS: No  FALLS:  Has patient fallen in last 6 months? 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  The joint spaces well-maintained.  Lumbar spine 2 views: No acute fractures.  Grade 1 L4 on L5  spondylolisthesis.  Facet changes lower lumbar spine.  Normal lordotic  curvature.  PATIENT SURVEYS:  FOTO 35, predicted 55 in 14 visits   COGNITION: Overall cognitive status: Within functional limits for tasks assessed     SENSATION: Not tested    MUSCLE LENGTH:  HS WNL R, mild limitation L Piriformis WNL R, moderate limitation R   POSTURE: rounded shoulders, forward head, decreased lumbar lordosis, and increased thoracic kyphosis  PALPATION: L piriformis tight and tender, lateral thigh musculature TTP L, L HS mildly tender, L greater trochanter not TTP     LOWER EXTREMITY MMT:  MMT Right eval Left eval  Hip flexion 4+ 4  Hip extension    Hip abduction 4 3  Hip adduction    Hip internal rotation    Hip external rotation    Knee flexion 4+ 4+  Knee extension 4+ 4+  Ankle dorsiflexion 5 5  Ankle plantarflexion    Ankle inversion    Ankle eversion     (Blank rows = not tested)  LUMBAR ROM  Flexion AROM: mild limitation, RFIS  does improve pain Extension AROM: severe limitation and immediate radicular pain BLEs  Lateral lumbar flexion AROM mild limitation B pain free     TODAY'S TREATMENT:                                                                                                                              DATE:   Eval 08/30/23  Objective findings, POC, HEP   TherEx  Nustep L4x6 minutes BLEs only PPT + clamshells with red TB x10 Figure 4 stretch 1x30 seconds B SKTC 3x10 seconds B Hip extension with red TB at knees (L side only, radicular sx increase when attempting with R LE)    PATIENT EDUCATION:  Education details: exam findings, POC, HEP, likely involvement of lx spine in pain, role of PT in working to  strengthen and mobilize tissues without flaring pain  Person educated: Patient Education method: Explanation,  Demonstration, and Handouts Education comprehension: verbalized understanding, returned demonstration, and needs further education  HOME EXERCISE PROGRAM: Access Code: [email or web address] URL: [email or web address] Date: 08/30/2023 Prepared by: Nedra Hai  Exercises - Hooklying Clamshell with Resistance  - 1 x daily - 7 x weekly - 2 sets - 10 reps - 2 seconds  hold - Supine Figure 4 Piriformis Stretch  - 2 x daily - 7 x weekly - 1 sets - 2 reps - 30 seconds  hold - Supine Single Knee to Chest Stretch  - 2 x daily - 7 x weekly - 1 sets - 3 reps - 10 seconds  hold - Standing Hip Extension with Resistance at Ankles and Counter Support  - 1 x daily - 7 x weekly - 2 sets - 10 reps (left LE only due to increased radicular pain standing on L LE)  ASSESSMENT:  CLINICAL IMPRESSION: Patient is a 62 y.o. F who was seen today for physical therapy evaluation and treatment for M70.62 (ICD-10-CM) - Trochanteric bursitis, left hip. Exam with objective findings as above, however per subjective report and objective measures as well as pt reported recent finding of "slipped disc in my low back" and radicular symptoms replicated by specific movement patterns , I do suspect that majority of her symptoms may actually be coming from lumbar spine. Regardless, feel that she will benefit from skilled PT services to address all subjective concerns/objective findings and work towards functional goals.   OBJECTIVE IMPAIRMENTS: Abnormal gait, difficulty walking, decreased ROM, decreased strength, hypomobility, increased fascial restrictions, increased muscle spasms, impaired flexibility, improper body mechanics, postural dysfunction, and pain.   ACTIVITY LIMITATIONS: carrying, lifting, bending, standing, squatting, stairs, and locomotion level  PARTICIPATION LIMITATIONS: shopping, community activity,  occupation, and yard work  PERSONAL FACTORS: Behavior pattern, Fitness, Past/current experiences, and Time since onset of injury/illness/exacerbation are also affecting patient's functional outcome.   REHAB POTENTIAL: Good  CLINICAL DECISION MAKING: Stable/uncomplicated  EVALUATION COMPLEXITY: Low   GOALS: Goals reviewed with patient? No  SHORT TERM GOALS: Target date: 09/20/2023   Will be compliant with appropriate progressive HEP  Baseline: Goal status: INITIAL  2.  Will demonstrate improved awareness of posture in functional situations with use of ergonomic aides PRN/as desired  Baseline:  Goal status: INITIAL  3.  L LE mm flexibility impairments to have resolved  Baseline:  Goal status: INITIAL  4.  Will be able to walk for at least 30 minutes without increase in L hip pain to improve community access/QOL  Baseline:  Goal status: INITIAL    LONG TERM GOALS: Target date: 10/11/2023    MMT to improve by one grade all weak groups  Baseline:  Goal status: INITIAL  2.  Pain in L hip and low back to be no more than 3/10 at worse and will have no radicular symptoms  Baseline:  Goal status: INITIAL  3.  Will be able to stand and walk for at least 2 hours without increase from resting pain levels to improve community access/QOL  Baseline:  Goal status: INITIAL  4.  FOTO score to be within 5 points of predicted value by time of DC  Baseline:  Goal status: INITIAL    PLAN:  PT FREQUENCY: 2x/week  PT DURATION: 6 weeks  PLANNED INTERVENTIONS: Therapeutic exercises, Therapeutic activity, Gait training, Patient/Family education, Self Care, Joint mobilization, Stair training, Aquatic Therapy, Dry Needling, Electrical stimulation, Spinal mobilization, Cryotherapy, Moist heat, Taping, Traction, Ultrasound, Ionotophoresis 4mg /ml Dexamethasone, Manual therapy, and Re-evaluation  PLAN FOR NEXT SESSION:  caution with lumbar extension (increases radicular pain), work on hip  and lumbar flexibility, LE and core strength, postural strengthening/training. Could consider trial of lumbar traction, also consider manual and dry needling as desired L hip and lateral thigh   Nedra Hai, PT, DPT 08/30/23 8:49 AM

## 2023-09-06 ENCOUNTER — Encounter: Payer: Self-pay | Admitting: Physical Therapy

## 2023-09-06 ENCOUNTER — Ambulatory Visit: Payer: 59 | Admitting: Physical Therapy

## 2023-09-06 DIAGNOSIS — M5459 Other low back pain: Secondary | ICD-10-CM | POA: Diagnosis not present

## 2023-09-06 DIAGNOSIS — R29898 Other symptoms and signs involving the musculoskeletal system: Secondary | ICD-10-CM

## 2023-09-06 DIAGNOSIS — R262 Difficulty in walking, not elsewhere classified: Secondary | ICD-10-CM

## 2023-09-06 DIAGNOSIS — M6281 Muscle weakness (generalized): Secondary | ICD-10-CM

## 2023-09-06 DIAGNOSIS — M25552 Pain in left hip: Secondary | ICD-10-CM

## 2023-09-06 NOTE — Therapy (Signed)
OUTPATIENT PHYSICAL THERAPY TREATMENT   Patient Name: Jordan Christensen MRN: [id number] DOB:[birth year], 62 y.o., female Today's Date: 09/06/2023  END OF SESSION:  PT End of Session - 09/06/23 1019     Visit Number 2    Number of Visits 13    Date for PT Re-Evaluation 10/11/23    Authorization Type [employer]    Authorization Time Period 08/30/23 to 10/11/23    PT Start Time 1017    PT Stop Time 1050    PT Time Calculation (min) 33 min    Activity Tolerance Patient tolerated treatment well    Behavior During Therapy WFL for tasks assessed/performed              Past Medical History:  Diagnosis Date   Asthma    Chronic pelvic pain in female    Chronic sinusitis    Depression    Endometrioma    hx   Fecal incontinence    GERD (gastroesophageal reflux disease)    Headache    Hyperlipidemia    Hypertension    Insomnia    Obesity    Scab    chronic scab on back throat   Past Surgical History:  Procedure Laterality Date   anal sphincter repair  1999   internal and external   APPENDECTOMY     BREAST BIOPSY Left 2009   BURCH PROCEDURE     and culdoplasty for stress urinary incontinence   CARPAL TUNNEL RELEASE     right and left   LEFT OOPHORECTOMY     MM BREAST STEREO BIOPSY LEFT ([facility] HX) Left 09/24/2008   TOTAL ABDOMINAL HYSTERECTOMY  1994   UPPER GASTROINTESTINAL ENDOSCOPY     Patient Active Problem List   Diagnosis Date Noted   Tachycardia 09/04/2019   Type 2 diabetes mellitus without complication, without long-term current use of insulin (HCC) 08/07/2019   HYPERCHOLESTEROLEMIA 05/08/2008   OBESITY, UNSPECIFIED 05/08/2008   ANXIETY 05/08/2008   DEPRESSION 05/08/2008   CARPAL TUNNEL SYNDROME 05/08/2008   HYPERTENSION 05/08/2008   SINUSITIS, CHRONIC 05/08/2008   ASTHMA 05/08/2008   GERD 05/08/2008   ENDOMETRIOSIS 05/08/2008   ACHILLES TENDINITIS 05/08/2008   PLANTAR FASCIITIS 05/08/2008   INSOMNIA UNSPECIFIED 05/08/2008   HOARSENESS 05/08/2008   NAUSEA  05/08/2008   INCONTINENCE, FECAL 05/08/2008   PELVIC PAIN, CHRONIC 05/08/2008   PREDIABETES 05/08/2008    PCP: Lonie Peak PA-C   REFERRING PROVIDER: Kirtland Bouchard, PA-C  REFERRING DIAG: [phone] (ICD-10-CM) - Trochanteric bursitis, left hip  THERAPY DIAG:  Pain in left hip  Other low back pain  Muscle weakness (generalized)  Difficulty in walking, not elsewhere classified  Other symptoms and signs involving the musculoskeletal system  Rationale for Evaluation and Treatment: Rehabilitation  ONSET DATE: March 2024  SUBJECTIVE:   SUBJECTIVE STATEMENT: "Maybe a little better."  PERTINENT HISTORY: Asthma, chronic pelvic pain, depression, fecal incontinence, HA, HLD, HTN, insomnia, chronic scab in throat  PAIN:  Are you having pain? Yes: NPRS scale: 5/10 Pain location: hip L and B legs L >R Pain description: burning, aching, cramping  Aggravating factors: walking, standing  Relieving factors: laying down flat in supine, ice   PRECAUTIONS: None  RED FLAGS: None   WEIGHT BEARING RESTRICTIONS: No  FALLS:  Has patient fallen in last 6 months? 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  The joint spaces well-maintained.  Lumbar spine 2 views: No acute fractures.  Grade 1 L4 on L5  spondylolisthesis.  Facet changes lower lumbar spine.  Normal lordotic  curvature.  PATIENT SURVEYS:  08/30/23: FOTO 35, predicted 55 in 14 visits   COGNITION: Overall cognitive status: Within functional limits for tasks  assessed     SENSATION: Not tested  MUSCLE LENGTH:  HS WNL R, mild limitation L Piriformis WNL R, moderate limitation R   POSTURE: rounded shoulders, forward head, decreased lumbar lordosis, and increased thoracic kyphosis  PALPATION: L piriformis tight and tender, lateral thigh musculature TTP L, L HS mildly tender, L greater trochanter not TTP     LOWER EXTREMITY MMT:  MMT Right eval Left eval  Hip flexion 4+ 4  Hip extension    Hip abduction 4 3  Hip adduction    Hip internal rotation    Hip external rotation    Knee flexion 4+ 4+  Knee extension 4+ 4+  Ankle dorsiflexion 5 5  Ankle plantarflexion    Ankle inversion    Ankle eversion     (Blank rows = not tested)  LUMBAR ROM  Flexion AROM: mild limitation, RFIS  does improve pain Extension AROM: severe limitation and immediate radicular pain BLEs  Lateral lumbar flexion AROM mild limitation B pain free     TODAY'S TREATMENT:                                                                                                                              DATE:  09/06/23 TherEx SciFit bike L5 x 6 minutes BLEs only Hooklying single limb clamshell 2x10 bil; L4 band Bridges 2x10; 3-5 sec hold Single knee to chest 10 x 10 sec hold bil Piriformis stretch 5x15 sec hold bil Sidelying hip circles x 10-15 circles CW/CCW Seated pelvic tilts 10 x 5 sec hold   08/30/23 Objective findings, POC, HEP   TherEx Nustep L4x6 minutes BLEs only PPT + clamshells with red TB x10 Figure 4 stretch 1x30 seconds B SKTC 3x10 seconds B Hip extension with red TB at knees (L side only, radicular sx increase when attempting with R LE)    PATIENT EDUCATION:  Education details: exam findings, POC, HEP, likely involvement of lx spine in pain, role of PT in working to strengthen and mobilize tissues without flaring pain  Person educated: Patient Education method: Programmer, multimedia, Facilities manager, and Handouts Education comprehension:  verbalized understanding, returned demonstration, and needs further education  HOME EXERCISE PROGRAM: Access Code: [email or web address] URL: [email or web address] Date: 09/06/2023 Prepared by: Moshe Cipro  Exercises - Hooklying Clamshell with Resistance  - 1 x daily - 7 x weekly - 2 sets - 10  reps - 2 seconds  hold - Supine Single Knee to Chest Stretch  - 2 x daily - 7 x weekly - 1 sets - 3 reps - 10 seconds  hold - Supine Bridge  - 1 x daily - 7 x weekly - 2 sets - 10 reps - 5 sec hold - Supine Piriformis Stretch with Foot on Ground  - 2 x daily - 7 x weekly - 1 sets - 5 reps - 10-15 sec hold - Sidelying Hip Circles  - 2 x daily - 7 x weekly - 2 sets - 10 circles each way  ASSESSMENT:  CLINICAL IMPRESSION: Modified HEP today as hip extension caused increase in pain.  She tolerated session well today and will continue to benefit from PT to maximize function. May benefit from DN and/or estim to help with pain.   OBJECTIVE IMPAIRMENTS: Abnormal gait, difficulty walking, decreased ROM, decreased strength, hypomobility, increased fascial restrictions, increased muscle spasms, impaired flexibility, improper body mechanics, postural dysfunction, and pain.   ACTIVITY LIMITATIONS: carrying, lifting, bending, standing, squatting, stairs, and locomotion level  PARTICIPATION LIMITATIONS: shopping, community activity, occupation, and yard work  PERSONAL FACTORS: Behavior pattern, Fitness, Past/current experiences, and Time since onset of injury/illness/exacerbation are also affecting patient's functional outcome.   REHAB POTENTIAL: Good  CLINICAL DECISION MAKING: Stable/uncomplicated  EVALUATION COMPLEXITY: Low   GOALS: Goals reviewed with patient? No  SHORT TERM GOALS: Target date: 09/20/2023   Will be compliant with appropriate progressive HEP  Baseline: Goal status: INITIAL  2.  Will demonstrate improved awareness of posture in functional situations with use of ergonomic  aides PRN/as desired  Baseline:  Goal status: INITIAL  3.  L LE mm flexibility impairments to have resolved  Baseline:  Goal status: INITIAL  4.  Will be able to walk for at least 30 minutes without increase in L hip pain to improve community access/QOL  Baseline:  Goal status: INITIAL    LONG TERM GOALS: Target date: 10/11/2023    MMT to improve by one grade all weak groups  Baseline:  Goal status: INITIAL  2.  Pain in L hip and low back to be no more than 3/10 at worse and will have no radicular symptoms  Baseline:  Goal status: INITIAL  3.  Will be able to stand and walk for at least 2 hours without increase from resting pain levels to improve community access/QOL  Baseline:  Goal status: INITIAL  4.  FOTO score to be within 5 points of predicted value by time of DC  Baseline:  Goal status: INITIAL    PLAN:  PT FREQUENCY: 2x/week  PT DURATION: 6 weeks  PLANNED INTERVENTIONS: Therapeutic exercises, Therapeutic activity, Gait training, Patient/Family education, Self Care, Joint mobilization, Stair training, Aquatic Therapy, Dry Needling, Electrical stimulation, Spinal mobilization, Cryotherapy, Moist heat, Taping, Traction, Ultrasound, Ionotophoresis 4mg /ml Dexamethasone, Manual therapy, and Re-evaluation  PLAN FOR NEXT SESSION: neutral core exercises, caution with lumbar extension (increases radicular pain), work on hip and lumbar flexibility, LE and core strength, postural strengthening/training. Could consider trial of lumbar traction, also consider manual and dry needling as desired L hip and lateral thigh     Clarita Crane, PT, DPT 09/06/23 11:07 AM

## 2023-09-07 ENCOUNTER — Encounter: Payer: Self-pay | Admitting: Physical Therapy

## 2023-09-07 ENCOUNTER — Ambulatory Visit (INDEPENDENT_AMBULATORY_CARE_PROVIDER_SITE_OTHER): Payer: 59 | Admitting: Physical Therapy

## 2023-09-07 DIAGNOSIS — M6281 Muscle weakness (generalized): Secondary | ICD-10-CM | POA: Diagnosis not present

## 2023-09-07 DIAGNOSIS — M5459 Other low back pain: Secondary | ICD-10-CM

## 2023-09-07 DIAGNOSIS — M25552 Pain in left hip: Secondary | ICD-10-CM | POA: Diagnosis not present

## 2023-09-07 DIAGNOSIS — R262 Difficulty in walking, not elsewhere classified: Secondary | ICD-10-CM | POA: Diagnosis not present

## 2023-09-07 DIAGNOSIS — R29898 Other symptoms and signs involving the musculoskeletal system: Secondary | ICD-10-CM

## 2023-09-07 NOTE — Therapy (Signed)
OUTPATIENT PHYSICAL THERAPY TREATMENT   Patient Name: Jordan Christensen MRN: [id number] DOB:1961-07-22, 62 y.o., female Today's Date: 09/07/2023  END OF SESSION:  PT End of Session - 09/07/23 0959     Visit Number 3    Number of Visits 13    Date for PT Re-Evaluation 10/11/23    Authorization Type [employer]    Authorization Time Period 08/30/23 to 10/11/23    PT Start Time 1000    PT Stop Time 1040    PT Time Calculation (min) 40 min    Activity Tolerance Patient tolerated treatment well    Behavior During Therapy WFL for tasks assessed/performed               Past Medical History:  Diagnosis Date   Asthma    Chronic pelvic pain in female    Chronic sinusitis    Depression    Endometrioma    hx   Fecal incontinence    GERD (gastroesophageal reflux disease)    Headache    Hyperlipidemia    Hypertension    Insomnia    Obesity    Scab    chronic scab on back throat   Past Surgical History:  Procedure Laterality Date   anal sphincter repair  1999   internal and external   APPENDECTOMY     BREAST BIOPSY Left 2009   BURCH PROCEDURE     and culdoplasty for stress urinary incontinence   CARPAL TUNNEL RELEASE     right and left   LEFT OOPHORECTOMY     MM BREAST STEREO BIOPSY LEFT ([facility] HX) Left 09/24/2008   TOTAL ABDOMINAL HYSTERECTOMY  1994   UPPER GASTROINTESTINAL ENDOSCOPY     Patient Active Problem List   Diagnosis Date Noted   Tachycardia 09/04/2019   Type 2 diabetes mellitus without complication, without long-term current use of insulin (HCC) 08/07/2019   HYPERCHOLESTEROLEMIA 05/08/2008   OBESITY, UNSPECIFIED 05/08/2008   ANXIETY 05/08/2008   DEPRESSION 05/08/2008   CARPAL TUNNEL SYNDROME 05/08/2008   HYPERTENSION 05/08/2008   SINUSITIS, CHRONIC 05/08/2008   ASTHMA 05/08/2008   GERD 05/08/2008   ENDOMETRIOSIS 05/08/2008   ACHILLES TENDINITIS 05/08/2008   PLANTAR FASCIITIS 05/08/2008   INSOMNIA UNSPECIFIED 05/08/2008   HOARSENESS 05/08/2008   NAUSEA  05/08/2008   INCONTINENCE, FECAL 05/08/2008   PELVIC PAIN, CHRONIC 05/08/2008   PREDIABETES 05/08/2008    PCP: Lonie Peak PA-C   REFERRING PROVIDER: Kirtland Bouchard, PA-C  REFERRING DIAG: [phone] (ICD-10-CM) - Trochanteric bursitis, left hip  THERAPY DIAG:  Pain in left hip  Other low back pain  Muscle weakness (generalized)  Difficulty in walking, not elsewhere classified  Other symptoms and signs involving the musculoskeletal system  Rationale for Evaluation and Treatment: Rehabilitation  ONSET DATE: March 2024  SUBJECTIVE:   SUBJECTIVE STATEMENT: Pain is more sharp today along low back/ SI area.  PERTINENT HISTORY: Asthma, chronic pelvic pain, depression, fecal incontinence, HA, HLD, HTN, insomnia, chronic scab in throat  PAIN:  Are you having pain? Yes: NPRS scale: 7/10 Pain location: hip L and B legs L >R Pain description: burning, aching, cramping  Aggravating factors: walking, standing  Relieving factors: laying down flat in supine, ice   PRECAUTIONS: None  RED FLAGS: None   WEIGHT BEARING RESTRICTIONS: No  FALLS:  Has patient fallen in last 6 months? 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  The joint spaces well-maintained.  Lumbar spine 2 views: No acute fractures.  Grade 1 L4 on L5  spondylolisthesis.  Facet changes lower lumbar spine.  Normal lordotic  curvature.  PATIENT SURVEYS:  08/30/23: FOTO 35, predicted 55 in 14 visits   COGNITION: Overall cognitive status: Within functional limits for tasks  assessed     SENSATION: Not tested  MUSCLE LENGTH:  HS WNL R, mild limitation L Piriformis WNL R, moderate limitation R   POSTURE: rounded shoulders, forward head, decreased lumbar lordosis, and increased thoracic kyphosis  PALPATION: L piriformis tight and tender, lateral thigh musculature TTP L, L HS mildly tender, L greater trochanter not TTP     LOWER EXTREMITY MMT:  MMT Right eval Left eval  Hip flexion 4+ 4  Hip extension    Hip abduction 4 3  Hip adduction    Hip internal rotation    Hip external rotation    Knee flexion 4+ 4+  Knee extension 4+ 4+  Ankle dorsiflexion 5 5  Ankle plantarflexion    Ankle inversion    Ankle eversion     (Blank rows = not tested)  LUMBAR ROM  Flexion AROM: mild limitation, RFIS  does improve pain Extension AROM: severe limitation and immediate radicular pain BLEs  Lateral lumbar flexion AROM mild limitation B pain free     TODAY'S TREATMENT:                                                                                                                              DATE:  09/07/23 TherEx SciFit bike L5 x 6 minutes BLEs only Single knee to chest 10 x 10 sec hold bil Hooklying single limb clamshell 2x10 bil; L4 band Hooklying march 2x10 bil; L4 band  Manual STM with compression to lumbar paraspinals; skilled palpation and monitoring of soft tissue during DN Trigger Point Dry-Needling  Treatment instructions: Expect mild to moderate muscle soreness. S/S of pneumothorax if dry needled over a lung field, and to seek immediate medical attention should they occur. Patient verbalized understanding of these instructions and education. Patient Consent Given: Yes Education handout provided: Yes Muscles treated: L5 multifidi Electrical stimulation performed: No Parameters: N/A Treatment response/outcome: twitch response noted; initial reports of no change; will assess next visit  Modalities IFC to low back x 12 min with supine  exercises  09/06/23 TherEx SciFit bike L5 x 6 minutes BLEs only Hooklying single limb clamshell 2x10 bil; L4 band Bridges 2x10; 3-5 sec hold Single knee to chest 10 x 10 sec hold bil Piriformis stretch 5x15 sec hold bil Sidelying hip circles x 10-15 circles CW/CCW Seated pelvic tilts 10  x 5 sec hold   08/30/23 Objective findings, POC, HEP   TherEx Nustep L4x6 minutes BLEs only PPT + clamshells with red TB x10 Figure 4 stretch 1x30 seconds B SKTC 3x10 seconds B Hip extension with red TB at knees (L side only, radicular sx increase when attempting with R LE)    PATIENT EDUCATION:  Education details: exam findings, POC, HEP, likely involvement of lx spine in pain, role of PT in working to strengthen and mobilize tissues without flaring pain  Person educated: Patient Education method: Programmer, multimedia, Facilities manager, and Handouts Education comprehension: verbalized understanding, returned demonstration, and needs further education  HOME EXERCISE PROGRAM: Access Code: [email or web address] URL: [email or web address] Date: 09/07/2023 Prepared by: Moshe Cipro  Exercises - Hooklying Clamshell with Resistance  - 1 x daily - 7 x weekly - 2 sets - 10 reps - 2 seconds  hold - Supine Single Knee to Chest Stretch  - 2 x daily - 7 x weekly - 1 sets - 3 reps - 10 seconds  hold - Supine Bridge  - 1 x daily - 7 x weekly - 2 sets - 10 reps - 5 sec hold - Supine Piriformis Stretch with Foot on Ground  - 2 x daily - 7 x weekly - 1 sets - 5 reps - 10-15 sec hold - Sidelying Hip Circles  - 2 x daily - 7 x weekly - 2 sets - 10 circles each way  Patient Education - TENS Unit - Trigger Point Dry Needling  ASSESSMENT:  CLINICAL IMPRESSION: Pt tolerated session well today with reduction in pain following session today.  Provided information on home TENS unit.  Will continue to benefit from PT to maximize function.  OBJECTIVE IMPAIRMENTS: Abnormal gait, difficulty walking, decreased ROM,  decreased strength, hypomobility, increased fascial restrictions, increased muscle spasms, impaired flexibility, improper body mechanics, postural dysfunction, and pain.   ACTIVITY LIMITATIONS: carrying, lifting, bending, standing, squatting, stairs, and locomotion level  PARTICIPATION LIMITATIONS: shopping, community activity, occupation, and yard work  PERSONAL FACTORS: Behavior pattern, Fitness, Past/current experiences, and Time since onset of injury/illness/exacerbation are also affecting patient's functional outcome.   REHAB POTENTIAL: Good  CLINICAL DECISION MAKING: Stable/uncomplicated  EVALUATION COMPLEXITY: Low   GOALS: Goals reviewed with patient? No  SHORT TERM GOALS: Target date: 09/20/2023   Will be compliant with appropriate progressive HEP  Baseline: Goal status: INITIAL  2.  Will demonstrate improved awareness of posture in functional situations with use of ergonomic aides PRN/as desired  Baseline:  Goal status: INITIAL  3.  L LE mm flexibility impairments to have resolved  Baseline:  Goal status: INITIAL  4.  Will be able to walk for at least 30 minutes without increase in L hip pain to improve community access/QOL  Baseline:  Goal status: INITIAL    LONG TERM GOALS: Target date: 10/11/2023    MMT to improve by one grade all weak groups  Baseline:  Goal status: INITIAL  2.  Pain in L hip and low back to be no more than 3/10 at worse and will have no radicular symptoms  Baseline:  Goal status: INITIAL  3.  Will be able to stand and walk for at least 2 hours without increase from resting pain levels to improve community access/QOL  Baseline:  Goal status: INITIAL  4.  FOTO score to be within 5 points of predicted value by time of DC  Baseline:  Goal status: INITIAL    PLAN:  PT FREQUENCY: 2x/week  PT DURATION: 6 weeks  PLANNED INTERVENTIONS: Therapeutic exercises, Therapeutic activity, Gait training, Patient/Family education, Self Care,  Joint mobilization, Stair training, Aquatic Therapy, Dry Needling, Electrical stimulation, Spinal mobilization, Cryotherapy, Moist heat, Taping, Traction, Ultrasound, Ionotophoresis 4mg /ml Dexamethasone, Manual therapy, and Re-evaluation  PLAN FOR NEXT SESSION: assess response to DN, repeat PRN,  neutral core exercises, caution with lumbar extension (increases radicular pain), work on hip and lumbar flexibility, LE and core strength, postural strengthening/training. Could consider trial of lumbar traction, also consider manual and dry needling as desired L hip and lateral thigh     Clarita Crane, PT, DPT 09/07/23 10:58 AM

## 2023-09-18 ENCOUNTER — Encounter: Payer: Self-pay | Admitting: Physician Assistant

## 2023-09-18 ENCOUNTER — Ambulatory Visit (INDEPENDENT_AMBULATORY_CARE_PROVIDER_SITE_OTHER): Payer: 59 | Admitting: Physical Therapy

## 2023-09-18 ENCOUNTER — Encounter: Payer: Self-pay | Admitting: Physical Therapy

## 2023-09-18 ENCOUNTER — Ambulatory Visit: Payer: 59 | Admitting: Physician Assistant

## 2023-09-18 DIAGNOSIS — M5416 Radiculopathy, lumbar region: Secondary | ICD-10-CM

## 2023-09-18 DIAGNOSIS — R262 Difficulty in walking, not elsewhere classified: Secondary | ICD-10-CM | POA: Diagnosis not present

## 2023-09-18 DIAGNOSIS — R29898 Other symptoms and signs involving the musculoskeletal system: Secondary | ICD-10-CM

## 2023-09-18 DIAGNOSIS — M5459 Other low back pain: Secondary | ICD-10-CM | POA: Diagnosis not present

## 2023-09-18 DIAGNOSIS — M25552 Pain in left hip: Secondary | ICD-10-CM | POA: Diagnosis not present

## 2023-09-18 DIAGNOSIS — M7062 Trochanteric bursitis, left hip: Secondary | ICD-10-CM

## 2023-09-18 DIAGNOSIS — M6281 Muscle weakness (generalized): Secondary | ICD-10-CM | POA: Diagnosis not present

## 2023-09-18 NOTE — Progress Notes (Signed)
HPI: Mrs. Hustead returns today follow-up of her low back pain and left hip pain.  She states the Medrol Dosepak really did not help.  She states that therapy seems to be beneficial particularly for her back.  Continues to have numbness tingling down both legs left greater than right.  Has pain in the left hip region in the buttocks area in the lateral aspect.  Has tingling and burning down the lateral aspect of the left leg to the knee.  Positive for back pain.  No groin pain.  Review of systems: See HPI otherwise negative or noncontributory.  Physical exam: General Well-developed well-nourished female no acute distress mood and affect appropriate. Lumbar spine: Has full forward flexion is able to touch her toes.  Has limited extension and has low back pain with extension.  Negative straight leg raise bilaterally.  5 out of 5 strength throughout the lower extremities against resistance. Bilateral hips: Excellent range of motion of both hips without pain.  Slight tenderness over the left hip trochanteric region.  Impression: Lumbar radiculopathy  Plan: Given the fact that she continues to have radicular pains particularly on the left recommend MRI of the lumbar spine to rule out HNP as a source of her radiculopathy.  Follow-up after the MRI to go over results discuss further treatment.  Questions were encouraged and answered at length.  She can continue therapy if she finds dry needling beneficial.

## 2023-09-18 NOTE — Addendum Note (Signed)
Addended by: Mardene Celeste B on: 09/18/2023 02:29 PM   Modules accepted: Orders

## 2023-09-18 NOTE — Therapy (Addendum)
OUTPATIENT PHYSICAL THERAPY TREATMENT  / DISCHARGE   Patient Name: Jordan Christensen MRN: [id number] DOB:12-Aug-1961, 62 y.o., female Today's Date: 09/18/2023  END OF SESSION:  PT End of Session - 09/18/23 0841     Visit Number 4    Number of Visits 13    Date for PT Re-Evaluation 10/11/23    Authorization Type U[employer]    Authorization Time Period 08/30/23 to 10/11/23    PT Start Time 0818   pt arrived late   PT Stop Time 0838    PT Time Calculation (min) 20 min    Activity Tolerance Patient tolerated treatment well    Behavior During Therapy WFL for tasks assessed/performed                Past Medical History:  Diagnosis Date   Asthma    Chronic pelvic pain in female    Chronic sinusitis    Depression    Endometrioma    hx   Fecal incontinence    GERD (gastroesophageal reflux disease)    Headache    Hyperlipidemia    Hypertension    Insomnia    Obesity    Scab    chronic scab on back throat   Past Surgical History:  Procedure Laterality Date   anal sphincter repair  1999   internal and external   APPENDECTOMY     BREAST BIOPSY Left 2009   BURCH PROCEDURE     and culdoplasty for stress urinary incontinence   CARPAL TUNNEL RELEASE     right and left   LEFT OOPHORECTOMY     MM BREAST STEREO BIOPSY LEFT ([facility] HX) Left 09/24/2008   TOTAL ABDOMINAL HYSTERECTOMY  1994   UPPER GASTROINTESTINAL ENDOSCOPY     Patient Active Problem List   Diagnosis Date Noted   Tachycardia 09/04/2019   Type 2 diabetes mellitus without complication, without long-term current use of insulin (HCC) 08/07/2019   HYPERCHOLESTEROLEMIA 05/08/2008   OBESITY, UNSPECIFIED 05/08/2008   ANXIETY 05/08/2008   DEPRESSION 05/08/2008   CARPAL TUNNEL SYNDROME 05/08/2008   HYPERTENSION 05/08/2008   SINUSITIS, CHRONIC 05/08/2008   ASTHMA 05/08/2008   GERD 05/08/2008   ENDOMETRIOSIS 05/08/2008   ACHILLES TENDINITIS 05/08/2008   PLANTAR FASCIITIS 05/08/2008   INSOMNIA UNSPECIFIED 05/08/2008    HOARSENESS 05/08/2008   NAUSEA 05/08/2008   INCONTINENCE, FECAL 05/08/2008   PELVIC PAIN, CHRONIC 05/08/2008   PREDIABETES 05/08/2008    PCP: Lonie Peak PA-C   REFERRING PROVIDER: Kirtland Bouchard, PA-C  REFERRING DIAG: [phone] (ICD-10-CM) - Trochanteric bursitis, left hip  THERAPY DIAG:  Pain in left hip  Other low back pain  Muscle weakness (generalized)  Difficulty in walking, not elsewhere classified  Other symptoms and signs involving the musculoskeletal system  Rationale for Evaluation and Treatment: Rehabilitation  ONSET DATE: March 2024  SUBJECTIVE:   SUBJECTIVE STATEMENT: DN lasted about a day but did have some relief; now feels pain "is about the same."  PERTINENT HISTORY: Asthma, chronic pelvic pain, depression, fecal incontinence, HA, HLD, HTN, insomnia, chronic scab in throat  PAIN:  Are you having pain? Yes: NPRS scale: 5/10 Pain location: hip L and B legs L >R Pain description: burning, aching, cramping  Aggravating factors: walking, standing  Relieving factors: laying down flat in supine, ice   PRECAUTIONS: None  RED FLAGS: None   WEIGHT BEARING RESTRICTIONS: No  FALLS:  Has patient fallen in last 6 months? 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  The joint spaces well-maintained.  Lumbar spine 2 views: No acute fractures.  Grade 1 L4 on L5  spondylolisthesis.  Facet changes lower lumbar spine.  Normal lordotic  curvature.  PATIENT SURVEYS:  08/30/23: FOTO 35, predicted 55 in 14 visits    COGNITION: Overall cognitive status: Within functional limits for tasks assessed     SENSATION: Not tested  MUSCLE LENGTH: HS WNL R, mild limitation L Piriformis WNL R, moderate limitation R   POSTURE: rounded shoulders, forward head, decreased lumbar lordosis, and increased thoracic kyphosis  PALPATION: L piriformis tight and tender, lateral thigh musculature TTP L, L HS mildly tender, L greater trochanter not TTP     LOWER EXTREMITY MMT:  MMT Right eval Left eval  Hip flexion 4+ 4  Hip extension    Hip abduction 4 3  Hip adduction    Hip internal rotation    Hip external rotation    Knee flexion 4+ 4+  Knee extension 4+ 4+  Ankle dorsiflexion 5 5  Ankle plantarflexion    Ankle inversion    Ankle eversion     (Blank rows = not tested)  LUMBAR ROM  Flexion AROM: mild limitation, RFIS  does improve pain Extension AROM: severe limitation and immediate radicular pain BLEs  Lateral lumbar flexion AROM mild limitation B pain free     TODAY'S TREATMENT:                                                                                                                              DATE:  09/18/23 TherEx NuStep L5 x 5 min Seated flexion stretch mid/Lt/Rt 5x5 sec hold each direction Seated piriformis stretch 3x10 sec bil   Manual STM with compression to lumbar paraspinals; skilled palpation and monitoring of soft tissue during DN Trigger Point Dry-Needling  Treatment instructions: Expect mild to moderate muscle soreness. S/S of pneumothorax if dry needled over a lung field, and to seek immediate medical attention should they occur. Patient verbalized understanding of these instructions and education. Patient Consent Given: Yes Education handout provided: Yes Muscles treated: L5 multifidi Electrical stimulation performed: No Parameters: N/A Treatment response/outcome: twitch response noted; initial reports of no change; will assess next  visit  09/07/23 TherEx SciFit bike L5 x 6 minutes BLEs only Single knee to chest 10 x 10 sec hold bil Hooklying single limb clamshell 2x10 bil; L4 band Hooklying march 2x10 bil; L4 band  Manual STM with compression to lumbar paraspinals; skilled palpation and monitoring of soft tissue during DN Trigger Point Dry-Needling  Treatment instructions: Expect mild  to moderate muscle soreness. S/S of pneumothorax if dry needled over a lung field, and to seek immediate medical attention should they occur. Patient verbalized understanding of these instructions and education. Patient Consent Given: Yes Education handout provided: Yes Muscles treated: L5 multifidi Electrical stimulation performed: No Parameters: N/A Treatment response/outcome: twitch response noted; initial reports of no change; will assess next visit  Modalities IFC to low back x 12 min with supine exercises  09/06/23 TherEx SciFit bike L5 x 6 minutes BLEs only Hooklying single limb clamshell 2x10 bil; L4 band Bridges 2x10; 3-5 sec hold Single knee to chest 10 x 10 sec hold bil Piriformis stretch 5x15 sec hold bil Sidelying hip circles x 10-15 circles CW/CCW Seated pelvic tilts 10 x 5 sec hold   08/30/23 Objective findings, POC, HEP   TherEx Nustep L4x6 minutes BLEs only PPT + clamshells with red TB x10 Figure 4 stretch 1x30 seconds B SKTC 3x10 seconds B Hip extension with red TB at knees (L side only, radicular sx increase when attempting with R LE)    PATIENT EDUCATION:  Education details: exam findings, POC, HEP, likely involvement of lx spine in pain, role of PT in working to strengthen and mobilize tissues without flaring pain  Person educated: Patient Education method: Programmer, multimedia, Facilities manager, and Handouts Education comprehension: verbalized understanding, returned demonstration, and needs further education  HOME EXERCISE PROGRAM: Access Code: [email or web address] URL: [email or web address] Date:  09/07/2023 Prepared by: Moshe Cipro  Exercises - Hooklying Clamshell with Resistance  - 1 x daily - 7 x weekly - 2 sets - 10 reps - 2 seconds  hold - Supine Single Knee to Chest Stretch  - 2 x daily - 7 x weekly - 1 sets - 3 reps - 10 seconds  hold - Supine Bridge  - 1 x daily - 7 x weekly - 2 sets - 10 reps - 5 sec hold - Supine Piriformis Stretch with Foot on Ground  - 2 x daily - 7 x weekly - 1 sets - 5 reps - 10-15 sec hold - Sidelying Hip Circles  - 2 x daily - 7 x weekly - 2 sets - 10 circles each way  Patient Education - TENS Unit - Trigger Point Dry Needling  ASSESSMENT:  CLINICAL IMPRESSION: Pt arrived late to session, so limited session today.  Tolerated well with positive response to DN last session so repeated again.  Sees PA after this visit so will see what he says and determine next steps.  Will continue to benefit from PT to maximize function.  OBJECTIVE IMPAIRMENTS: Abnormal gait, difficulty walking, decreased ROM, decreased strength, hypomobility, increased fascial restrictions, increased muscle spasms, impaired flexibility, improper body mechanics, postural dysfunction, and pain.   ACTIVITY LIMITATIONS: carrying, lifting, bending, standing, squatting, stairs, and locomotion level  PARTICIPATION LIMITATIONS: shopping, community activity, occupation, and yard work  PERSONAL FACTORS: Behavior pattern, Fitness, Past/current experiences, and Time since onset of injury/illness/exacerbation are also affecting patient's functional outcome.   REHAB POTENTIAL: Good  CLINICAL DECISION MAKING: Stable/uncomplicated  EVALUATION COMPLEXITY: Low   GOALS: Goals reviewed with patient? No  SHORT TERM GOALS: Target date: 09/20/2023   Will be compliant with appropriate progressive HEP  Baseline: Goal status: INITIAL  2.  Will demonstrate improved awareness of posture in functional situations with use of ergonomic aides PRN/as desired  Baseline:  Goal status:  INITIAL  3.  L LE mm flexibility impairments to have resolved  Baseline:  Goal status: INITIAL  4.  Will be able to walk  for at least 30 minutes without increase in L hip pain to improve community access/QOL  Baseline:  Goal status: INITIAL    LONG TERM GOALS: Target date: 10/11/2023    MMT to improve by one grade all weak groups  Baseline:  Goal status: INITIAL  2.  Pain in L hip and low back to be no more than 3/10 at worse and will have no radicular symptoms  Baseline:  Goal status: INITIAL  3.  Will be able to stand and walk for at least 2 hours without increase from resting pain levels to improve community access/QOL  Baseline:  Goal status: INITIAL  4.  FOTO score to be within 5 points of predicted value by time of DC  Baseline:  Goal status: INITIAL    PLAN:  PT FREQUENCY: 2x/week  PT DURATION: 6 weeks  PLANNED INTERVENTIONS: Therapeutic exercises, Therapeutic activity, Gait training, Patient/Family education, Self Care, Joint mobilization, Stair training, Aquatic Therapy, Dry Needling, Electrical stimulation, Spinal mobilization, Cryotherapy, Moist heat, Taping, Traction, Ultrasound, Ionotophoresis 4mg /ml Dexamethasone, Manual therapy, and Re-evaluation  PLAN FOR NEXT SESSION: check STGs,  assess response to DN, repeat PRN,  neutral core exercises, caution with lumbar extension (increases radicular pain), work on hip and lumbar flexibility, LE and core strength, postural strengthening/training. Could consider trial of lumbar traction   Clarita Crane, PT, DPT 09/18/23 8:43 AM   PHYSICAL THERAPY DISCHARGE SUMMARY  Visits from Start of Care: 4  Current functional level related to goals / functional outcomes: See note   Remaining deficits: See note   Education / Equipment: HEP  Patient goals were partially met. Patient is being discharged due to not returning since the last visit.  Chyrel Masson, PT, DPT, OCS, ATC 11/07/23  12:01  PM

## 2023-09-25 ENCOUNTER — Encounter: Payer: 59 | Admitting: Physical Therapy

## 2023-09-27 ENCOUNTER — Ambulatory Visit
Admission: RE | Admit: 2023-09-27 | Discharge: 2023-09-27 | Disposition: A | Discharge status: Home / Self Care | Payer: 59 | Source: Ambulatory Visit | Attending: Physician Assistant | Admitting: Physician Assistant

## 2023-09-27 DIAGNOSIS — M5416 Radiculopathy, lumbar region: Secondary | ICD-10-CM

## 2023-10-04 ENCOUNTER — Encounter: Payer: Self-pay | Admitting: Orthopaedic Surgery

## 2023-10-04 ENCOUNTER — Ambulatory Visit: Payer: 59 | Admitting: Orthopaedic Surgery

## 2023-10-04 DIAGNOSIS — M5416 Radiculopathy, lumbar region: Secondary | ICD-10-CM | POA: Diagnosis not present

## 2023-10-04 NOTE — Addendum Note (Signed)
Addended by: Mardene Celeste B on: 10/04/2023 11:13 AM   Modules accepted: Orders

## 2023-10-04 NOTE — Progress Notes (Signed)
HPI: Jordan Christensen returns today to go over the MRI of her lumbar spine.  She states that her durations remained unchanged.  She has radicular symptoms bilaterally but are worse on the left.  Rates her pain to be 8 out of 10 pain at worst.  She states that she has left buttocks pain that goes down to the popliteal region of the knee.  Occasionally she does note some  left lateral foot numbness and tingling.  MRI lumbar spine dated 10/02/2023 is reviewed with the patient.  Images also diagrams given to better demonstrate the anatomy.  MRI shows L4-5 anterior listhesis with moderate bilateral facet arthropathy.  Results in mild to moderate spinal canal stenosis and moderate bilateral foraminal stenosis.  L5-S1 central disc extrusion.  Compressing bilateral S1 nerve roots.  Moderate bilateral neural foraminal narrowing at the L5-S1 level.  Review of systems: See HPI otherwise negative  Physical exam: General Well-developed well-nourished female no acute distress.  Lower extremities 5 out of 5 strength bilaterally.  Negative straight leg raise bilaterally.   Impression: Lumbar radiculopathy   Plan: Will send her for epidural steroid injections lumbar spine with Dr. Alvester Morin.  See her back 4 weeks after the injection to see what type of response she had.  Questions were encouraged and answered by Dr. Magnus Ivan and myself.

## 2023-10-10 ENCOUNTER — Telehealth: Payer: Self-pay | Admitting: Physical Medicine and Rehabilitation

## 2023-10-10 NOTE — Telephone Encounter (Signed)
Patient scheduled for 12/27/22.

## 2023-10-10 NOTE — Telephone Encounter (Signed)
Patient called and she is waiting to schedule an appointment. CB#[phone]

## 2023-10-18 ENCOUNTER — Other Ambulatory Visit: Payer: Self-pay

## 2023-10-18 ENCOUNTER — Ambulatory Visit: Payer: 59 | Admitting: Physical Medicine and Rehabilitation

## 2023-10-18 DIAGNOSIS — M5416 Radiculopathy, lumbar region: Secondary | ICD-10-CM | POA: Diagnosis not present

## 2023-10-18 MED ORDER — METHYLPREDNISOLONE ACETATE 40 MG/ML IJ SUSP
40.0000 mg | Freq: Once | INTRAMUSCULAR | Status: AC
  Administered 2023-10-18: 40 mg

## 2023-10-18 NOTE — Patient Instructions (Signed)

## 2023-10-18 NOTE — Progress Notes (Signed)
Functional Pain Scale - descriptive words and definitions  Distracting (5)    Aware of pain/able to complete some ADL's but limited by pain/sleep is affected and active distractions are only slightly useful. Moderate range order  Average Pain 5  123/83 Having some numbness in her L leg   +Driver, -BT, -Dye Allergies.

## 2023-10-19 ENCOUNTER — Ambulatory Visit: Payer: 59

## 2023-10-19 VITALS — Ht 67.0 in | Wt 200.0 lb

## 2023-10-19 DIAGNOSIS — Z1211 Encounter for screening for malignant neoplasm of colon: Secondary | ICD-10-CM

## 2023-10-19 MED ORDER — NA SULFATE-K SULFATE-MG SULF 17.5-3.13-1.6 GM/177ML PO SOLN: 1.0000 | Freq: Once | ORAL | 0 refills | Status: AC

## 2023-10-19 NOTE — Progress Notes (Signed)
Pre visit completed via phone call; Patient verified name, DOB, and address; No egg or soy allergy known to patient;  No issues known to pt with past sedation with any surgeries or procedures; Patient denies ever being told they had issues or difficulty with intubation;  No FH of Malignant Hyperthermia; Pt is not on diet pills; Pt is not on home 02;  Pt is not on blood thinners;  Pt denies issues with constipation;  No A fib or A flutter; Have any cardiac testing pending--NO Insurance verified during PV appt--- UHC Pt can ambulate without assistance;  Pt denies use of chewing tobacco; Discussed diabetic/weight loss medication holds; Discussed NSAID holds; Checked BMI to be less than 50; Pt instructed to use [employer] or GoodRx for a price reduction on prep;  Patient's chart reviewed by Cathlyn Parsons CNRA prior to previsit and patient appropriate for the [facility].  Pre visit completed and red dot placed by patient's name on their procedure day (on provider's schedule).    Instructions sent to MyChart per patient request;

## 2023-10-22 NOTE — Procedures (Signed)
S1 Lumbosacral Transforaminal Epidural Steroid Injection - Sub-Pedicular Approach with Fluoroscopic Guidance   Patient: Jordan Christensen      Date of Birth: Jul 17, 1961 MRN: [id number] PCP: Lonie Peak, PA-C      Visit Date: 10/18/2023   Universal Protocol:    Date/Time: 11/10/241:37 PM  Consent Given By: the patient  Position:  PRONE  Additional Comments: Vital signs were monitored before and after the procedure. Patient was prepped and draped in the usual sterile fashion. The correct patient, procedure, and site was verified.   Injection Procedure Details:  Procedure Site One Meds Administered:  Meds ordered this encounter  Medications   methylPREDNISolone acetate (DEPO-MEDROL) injection 40 mg    Laterality: Left  Location/Site:  S1 Foramen   Needle size: 22 ga.  Needle type: Spinal  Needle Placement: Transforaminal  Findings:   -Comments: Excellent flow of contrast along the nerve, nerve root and into the epidural space.  Epidurogram: Contrast epidurogram showed no nerve root cut off or restricted flow pattern.  Procedure Details: After squaring off the sacral end-plate to get a true AP view, the C-arm was positioned so that the best possible view of the S1 foramen was visualized. The soft tissues overlying this structure were infiltrated with 2-3 ml. of 1% Lidocaine without Epinephrine.    The spinal needle was inserted toward the target using a "trajectory" view along the fluoroscope beam.  Under AP and lateral visualization, the needle was advanced so it did not puncture dura. Biplanar projections were used to confirm position. Aspiration was confirmed to be negative for CSF and/or blood. A 1-2 ml. volume of Isovue-250 was injected and flow of contrast was noted at each level. Radiographs were obtained for documentation purposes.   After attaining the desired flow of contrast documented above, a 0.5 to 1.0 ml test dose of 0.25% Marcaine was injected into each  respective transforaminal space.  The patient was observed for 90 seconds post injection.  After no sensory deficits were reported, and normal lower extremity motor function was noted,   the above injectate was administered so that equal amounts of the injectate were placed at each foramen (level) into the transforaminal epidural space.   Additional Comments:  No complications occurred Dressing: Band-Aid with 2 x 2 sterile gauze    Post-procedure details: Patient was observed during the procedure. Post-procedure instructions were reviewed.  Patient left the clinic in stable condition.

## 2023-10-22 NOTE — Progress Notes (Signed)
Jordan Christensen - 62 y.o. female MRN [id number]  Date of birth: 1961-08-27  Office Visit Note: Visit Date: 10/18/2023 PCP: Lonie Peak, PA-C Referred by: Lonie Peak, PA-C  Subjective: Chief Complaint  Patient presents with   Lower Back - Pain   HPI:  Jordan Christensen is a 62 y.o. female who comes in today at the request of Dr. Doneen Poisson for planned Left S1-2 Lumbar Transforaminal epidural steroid injection with fluoroscopic guidance.  The patient has failed conservative care including home exercise, medications, time and activity modification.  This injection will be diagnostic and hopefully therapeutic.  Please see requesting physician notes for further details and justification.   ROS Otherwise per HPI.  Assessment & Plan: Visit Diagnoses:    ICD-10-CM   1. Lumbar radiculopathy  M54.16 XR C-ARM NO REPORT    Epidural Steroid injection    methylPREDNISolone acetate (DEPO-MEDROL) injection 40 mg      Plan: No additional findings.   Meds & Orders:  Meds ordered this encounter  Medications   methylPREDNISolone acetate (DEPO-MEDROL) injection 40 mg    Orders Placed This Encounter  Procedures   XR C-ARM NO REPORT   Epidural Steroid injection    Follow-up: Return for visit to requesting provider as needed.   Procedures: No procedures performed  S1 Lumbosacral Transforaminal Epidural Steroid Injection - Sub-Pedicular Approach with Fluoroscopic Guidance   Patient: Jordan Christensen      Date of Birth: [birth year] MRN: [id number] PCP: Lonie Peak, PA-C      Visit Date: 10/18/2023   Universal Protocol:    Date/Time: 11/10/241:37 PM  Consent Given By: the patient  Position:  PRONE  Additional Comments: Vital signs were monitored before and after the procedure. Patient was prepped and draped in the usual sterile fashion. The correct patient, procedure, and site was verified.   Injection Procedure Details:  Procedure Site One Meds Administered:   Meds ordered this encounter  Medications   methylPREDNISolone acetate (DEPO-MEDROL) injection 40 mg    Laterality: Left  Location/Site:  S1 Foramen   Needle size: 22 ga.  Needle type: Spinal  Needle Placement: Transforaminal  Findings:   -Comments: Excellent flow of contrast along the nerve, nerve root and into the epidural space.  Epidurogram: Contrast epidurogram showed no nerve root cut off or restricted flow pattern.  Procedure Details: After squaring off the sacral end-plate to get a true AP view, the C-arm was positioned so that the best possible view of the S1 foramen was visualized. The soft tissues overlying this structure were infiltrated with 2-3 ml. of 1% Lidocaine without Epinephrine.    The spinal needle was inserted toward the target using a "trajectory" view along the fluoroscope beam.  Under AP and lateral visualization, the needle was advanced so it did not puncture dura. Biplanar projections were used to confirm position. Aspiration was confirmed to be negative for CSF and/or blood. A 1-2 ml. volume of Isovue-250 was injected and flow of contrast was noted at each level. Radiographs were obtained for documentation purposes.   After attaining the desired flow of contrast documented above, a 0.5 to 1.0 ml test dose of 0.25% Marcaine was injected into each respective transforaminal space.  The patient was observed for 90 seconds post injection.  After no sensory deficits were reported, and normal lower extremity motor function was noted,   the above injectate was administered so that equal amounts of the injectate were placed at each foramen (level) into the transforaminal epidural space.  Additional Comments:  No complications occurred Dressing: Band-Aid with 2 x 2 sterile gauze    Post-procedure details: Patient was observed during the procedure. Post-procedure instructions were reviewed.  Patient left the clinic in stable condition.   Clinical  History: MRI LUMBAR SPINE WITHOUT CONTRAST   TECHNIQUE: Multiplanar, multisequence MR imaging of the lumbar spine was performed. No intravenous contrast was administered.   COMPARISON:  Lumbar spine radiographs 08/21/2023.   FINDINGS: Segmentation: Conventional numbering is assumed with 5 non-rib-bearing, lumbar type vertebral bodies.   Alignment:  Grade 1 anterolisthesis of L4 on L5.   Vertebrae: Modic type 2 degenerative endplate marrow signal changes at L5-S1.   Conus medullaris and cauda equina: Conus extends to the L1 level. Conus and cauda equina appear normal.   Paraspinal and other soft tissues: No acute findings.   Disc levels:   T12-L1:  Normal.   L1-L2: Small disc bulge without spinal canal stenosis or neural foraminal narrowing.   L2-L3: Left eccentric disc bulge and facet arthropathy results in mild spinal canal stenosis and moderate left neural foraminal narrowing.   L3-L4: Disc bulge and facet arthropathy results in mild spinal canal stenosis.   L4-L5: Anterolisthesis with uncovered disc and moderate bilateral facet arthropathy results in mild-to-moderate spinal canal stenosis and moderate bilateral neural foraminal narrowing.   L5-S1: Central disc extrusion with caudal migration results in compression of the traversing bilateral S1 nerve roots in the subarticular zones. Facet arthropathy contributes to moderate bilateral neural foraminal narrowing.   IMPRESSION: 1. Multilevel lumbar spondylosis, worst at L5-S1, where a central disc extrusion with caudal migration results in compression of the traversing bilateral S1 nerve roots in the subarticular zones. 2. Mild-to-moderate spinal canal stenosis and moderate bilateral neural foraminal narrowing at L4-L5. 3. Mild spinal canal stenosis and moderate left neural foraminal narrowing at L2-L3.     Electronically Signed   By: Orvan Falconer M.D.   On: 10/02/2023 14:35     Objective:  VS:  HT:     WT:   BMI:     BP:   HR: bpm  TEMP: ( )  RESP:  Physical Exam Vitals and nursing note reviewed.  Constitutional:      General: She is not in acute distress.    Appearance: Normal appearance. She is not ill-appearing.  HENT:     Head: Normocephalic and atraumatic.     Right Ear: External ear normal.     Left Ear: External ear normal.  Eyes:     Extraocular Movements: Extraocular movements intact.  Cardiovascular:     Rate and Rhythm: Normal rate.     Pulses: Normal pulses.  Pulmonary:     Effort: Pulmonary effort is normal. No respiratory distress.  Abdominal:     General: There is no distension.     Palpations: Abdomen is soft.  Musculoskeletal:        General: Tenderness present.     Cervical back: Neck supple.     Right lower leg: No edema.     Left lower leg: No edema.     Comments: Patient has good distal strength with no pain over the greater trochanters.  No clonus or focal weakness.  Skin:    Findings: No erythema, lesion or rash.  Neurological:     General: No focal deficit present.     Mental Status: She is alert and oriented to person, place, and time.     Sensory: No sensory deficit.     Motor: No weakness  or abnormal muscle tone.     Coordination: Coordination normal.  Psychiatric:        Mood and Affect: Mood normal.        Behavior: Behavior normal.      Imaging: No results found.

## 2023-10-25 ENCOUNTER — Encounter: Payer: Self-pay | Admitting: Ophthalmology

## 2023-10-26 ENCOUNTER — Encounter: Payer: Self-pay | Admitting: Internal Medicine

## 2023-10-30 NOTE — Discharge Instructions (Signed)

## 2023-10-31 ENCOUNTER — Encounter
Admission: RE | Disposition: A | Discharge status: Home / Self Care | Payer: Self-pay | Source: Home / Self Care | Attending: Ophthalmology

## 2023-10-31 ENCOUNTER — Ambulatory Visit
Admission: RE | Admit: 2023-10-31 | Discharge: 2023-10-31 | Disposition: A | Discharge status: Home / Self Care | Payer: 59 | Attending: Ophthalmology | Admitting: Ophthalmology

## 2023-10-31 ENCOUNTER — Ambulatory Visit: Payer: 59 | Admitting: Anesthesiology

## 2023-10-31 ENCOUNTER — Other Ambulatory Visit: Payer: Self-pay

## 2023-10-31 ENCOUNTER — Encounter: Payer: Self-pay | Admitting: Ophthalmology

## 2023-10-31 DIAGNOSIS — G8929 Other chronic pain: Secondary | ICD-10-CM | POA: Diagnosis not present

## 2023-10-31 DIAGNOSIS — Z7985 Long-term (current) use of injectable non-insulin antidiabetic drugs: Secondary | ICD-10-CM | POA: Diagnosis not present

## 2023-10-31 DIAGNOSIS — J45909 Unspecified asthma, uncomplicated: Secondary | ICD-10-CM | POA: Diagnosis not present

## 2023-10-31 DIAGNOSIS — I10 Essential (primary) hypertension: Secondary | ICD-10-CM | POA: Insufficient documentation

## 2023-10-31 DIAGNOSIS — E669 Obesity, unspecified: Secondary | ICD-10-CM | POA: Insufficient documentation

## 2023-10-31 DIAGNOSIS — Z87891 Personal history of nicotine dependence: Secondary | ICD-10-CM | POA: Diagnosis not present

## 2023-10-31 DIAGNOSIS — Z7984 Long term (current) use of oral hypoglycemic drugs: Secondary | ICD-10-CM | POA: Insufficient documentation

## 2023-10-31 DIAGNOSIS — R102 Pelvic and perineal pain: Secondary | ICD-10-CM | POA: Insufficient documentation

## 2023-10-31 DIAGNOSIS — H2512 Age-related nuclear cataract, left eye: Secondary | ICD-10-CM | POA: Insufficient documentation

## 2023-10-31 DIAGNOSIS — Z6831 Body mass index (BMI) 31.0-31.9, adult: Secondary | ICD-10-CM | POA: Insufficient documentation

## 2023-10-31 DIAGNOSIS — K219 Gastro-esophageal reflux disease without esophagitis: Secondary | ICD-10-CM | POA: Diagnosis not present

## 2023-10-31 HISTORY — DX: Personal history of other diseases of the circulatory system: Z86.79

## 2023-10-31 HISTORY — PX: CATARACT EXTRACTION W/PHACO: SHX586

## 2023-10-31 LAB — GLUCOSE, CAPILLARY: Glucose-Capillary: 198 mg/dL — ABNORMAL HIGH (ref 70–99)

## 2023-10-31 SURGERY — PHACOEMULSIFICATION, CATARACT, WITH IOL INSERTION
Anesthesia: Monitor Anesthesia Care | Site: Eye | Laterality: Left

## 2023-10-31 MED ORDER — MIDAZOLAM HCL 2 MG/2ML IJ SOLN
INTRAMUSCULAR | Status: AC
  Filled 2023-10-31: qty 2

## 2023-10-31 MED ORDER — SIGHTPATH DOSE#1 BSS IO SOLN
INTRAOCULAR | Status: DC | PRN
  Administered 2023-10-31: 15 mL

## 2023-10-31 MED ORDER — ARMC OPHTHALMIC DILATING DROPS
1.0000 | OPHTHALMIC | Status: DC | PRN
Start: 2023-10-31 — End: 2023-10-31
  Administered 2023-10-31 (×3): 1 via OPHTHALMIC

## 2023-10-31 MED ORDER — MOXIFLOXACIN HCL 0.5 % OP SOLN
OPHTHALMIC | Status: DC | PRN
  Administered 2023-10-31: .2 mL via OPHTHALMIC

## 2023-10-31 MED ORDER — FENTANYL CITRATE (PF) 100 MCG/2ML IJ SOLN
INTRAMUSCULAR | Status: DC | PRN
  Administered 2023-10-31: 50 ug via INTRAVENOUS

## 2023-10-31 MED ORDER — TETRACAINE HCL 0.5 % OP SOLN
1.0000 [drp] | OPHTHALMIC | Status: DC | PRN
Start: 2023-10-31 — End: 2023-10-31
  Administered 2023-10-31 (×3): 1 [drp] via OPHTHALMIC

## 2023-10-31 MED ORDER — BRIMONIDINE TARTRATE-TIMOLOL 0.2-0.5 % OP SOLN
OPHTHALMIC | Status: DC | PRN
  Administered 2023-10-31: 1 [drp] via OPHTHALMIC

## 2023-10-31 MED ORDER — ARMC OPHTHALMIC DILATING DROPS
OPHTHALMIC | Status: AC
Start: 2023-10-31 — End: ?
  Filled 2023-10-31: qty 0.5

## 2023-10-31 MED ORDER — SIGHTPATH DOSE#1 BSS IO SOLN
INTRAOCULAR | Status: DC | PRN
  Administered 2023-10-31: 56 mL via OPHTHALMIC

## 2023-10-31 MED ORDER — SIGHTPATH DOSE#1 NA CHONDROIT SULF-NA HYALURON 40-17 MG/ML IO SOLN
INTRAOCULAR | Status: DC | PRN
  Administered 2023-10-31: 1 mL via INTRAOCULAR

## 2023-10-31 MED ORDER — MIDAZOLAM HCL 2 MG/2ML IJ SOLN
INTRAMUSCULAR | Status: DC | PRN
  Administered 2023-10-31 (×2): 1 mg via INTRAVENOUS

## 2023-10-31 MED ORDER — FENTANYL CITRATE (PF) 100 MCG/2ML IJ SOLN
INTRAMUSCULAR | Status: AC
  Filled 2023-10-31: qty 2

## 2023-10-31 MED ORDER — TETRACAINE HCL 0.5 % OP SOLN
OPHTHALMIC | Status: AC
Start: 2023-10-31 — End: ?
  Filled 2023-10-31: qty 4

## 2023-10-31 MED ORDER — SIGHTPATH DOSE#1 BSS IO SOLN
INTRAOCULAR | Status: DC | PRN
  Administered 2023-10-31: 1 mL via INTRAMUSCULAR

## 2023-10-31 SURGICAL SUPPLY — 12 items
ANGLE REVERSE CUT SHRT 25GA (CUTTER) ×1
CARTRIDGE C MONARCH II (MISCELLANEOUS) IMPLANT
CATARACT SUITE SIGHTPATH (MISCELLANEOUS) ×1
CYSTOTOME ANGL RVRS SHRT 25G (CUTTER) ×1 IMPLANT
FEE CATARACT SUITE SIGHTPATH (MISCELLANEOUS) ×1 IMPLANT
GLOVE BIOGEL PI IND STRL 8 (GLOVE) ×1 IMPLANT
GLOVE SURG LX STRL 8.0 MICRO (GLOVE) ×1 IMPLANT
LENS CLAREON VIVITY TORIC 28.5 ×1 IMPLANT
LENS IOL CLRN VT TRC 5 28.5 IMPLANT
NDL FILTER BLUNT 18X1 1/2 (NEEDLE) ×1 IMPLANT
NEEDLE FILTER BLUNT 18X1 1/2 (NEEDLE) ×1
SYR 3ML LL SCALE MARK (SYRINGE) ×1 IMPLANT

## 2023-10-31 NOTE — H&P (Signed)
[facility]   Primary Care Physician:  Lonie Peak, PA-C Ophthalmologist: Dr. Druscilla Brownie  Pre-Procedure History & Physical: HPI:  Jordan Christensen is a 61 y.o. female here for cataract surgery.   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 Authorizing Provider  Albuterol Sulfate (PROAIR HFA IN) Inhale into the lungs as needed.   Yes [provider]  ascorbic acid (VITAMIN C) 500 MG tablet Take by mouth.   Yes [provider]  Cranberry-Vitamin C ([facility]) 140-100 MG CAPS Take by mouth.   Yes [provider]  Dulaglutide (TRULICITY) 0.75 MG/0.5ML SOPN Inject into the skin. 08/25/22  Yes [provider]  DULoxetine (CYMBALTA) 30 MG capsule 60 mg.   Yes [provider]  esomeprazole (NEXIUM) 40 MG capsule    Yes [provider]  estradiol (ESTRACE) 0.1 MG/GM vaginal cream Place vaginally. 05/15/23  Yes [provider]  ibuprofen (ADVIL) 600 MG tablet Take 1 tablet by mouth every 6 (six) hours as needed.   Yes [provider]  losartan-hydrochlorothiazide (HYZAAR) 100-25 MG tablet Take 1 tablet by mouth daily.   Yes [provider]  metFORMIN (GLUCOPHAGE-XR) 500 MG 24 hr tablet Take 500 mg by mouth 2 (two) times daily with a meal. 08/02/19  Yes [provider]  metoprolol tartrate (LOPRESSOR) 50 MG tablet Take 1 tablet by mouth 2 (two) times daily. 11/11/19  Yes [provider]  nabumetone (RELAFEN) 750 MG tablet Take 1 tablet (750 mg total) by mouth 2 (two) times daily as needed. 08/30/23  Yes Kathryne Hitch, MD  pantoprazole (PROTONIX) 40 MG tablet Take 1 tablet (40 mg total) by mouth daily. 01/07/14  Yes Hart Carwin, MD  Probiotic Product (PROBIOTIC PO) Take 1 capsule by mouth daily at 6 (six) AM.   Yes [provider]  rosuvastatin (CRESTOR) 40 MG tablet Take 40 mg by mouth daily.   Yes [provider]  trimethoprim (TRIMPEX) 100 MG tablet Take 100 mg by mouth at bedtime.   Yes [provider]    Allergies as of 10/17/2023 - Review Complete 10/04/2023  Allergen Reaction Noted   Sulfonamide derivatives  05/08/2008    Family History  Problem Relation Age of Onset   Diabetes Mother    Heart disease Mother    Liver cancer Father  Heart disease Brother    Breast cancer Maternal Aunt    Colon cancer Neg Hx    Esophageal cancer Neg Hx    Rectal cancer Neg Hx    Stomach cancer Neg Hx    Colon polyps Neg Hx     Social History   Socioeconomic History   Marital status: Married    Spouse name: Not on file   Number of children: Not on file   Years of education: Not on file   Highest education level: Not on file  Occupational History   Not on file  Tobacco Use   Smoking status: Former    Current packs/day: 0.00    Average packs/day: 1 pack/day for 5.7 years (5.7 ttl pk-yrs)    Types: Cigarettes    Start date: 70    Quit date: 08/20/1988    Years since quitting: 35.2   Smokeless tobacco: Never  Vaping Use   Vaping  status: Never Used  Substance and Sexual Activity   Alcohol use: Yes    Alcohol/week: 2.0 standard drinks of alcohol    Types: 2 Cans of beer per week    Comment: social   Drug use: No   Sexual activity: Not on file  Other Topics Concern   Not on file  Social History Narrative   Not on file   Social Determinants of Health   Financial Resource Strain: Not on file  Food Insecurity: Not on file  Transportation Needs: Not on file  Physical Activity: Not on file  Stress: Not on file  Social Connections: Not on file  Intimate Partner Violence: Not on file    Review of Systems: See HPI, otherwise negative ROS  Physical Exam: BP 127/79   Pulse 76   Temp 98 F (36.7 C) (Temporal)   Resp 18   Ht 5\' 7"  (1.702 m)   Wt 90 kg   SpO2 96%   BMI 31.08 kg/m  General:   Alert, cooperative in NAD Head:  Normocephalic and atraumatic. Respiratory:  Normal work of breathing. Cardiovascular:  RRR  Impression/Plan: Jordan Christensen is here for cataract surgery.  Risks, benefits, limitations, and alternatives regarding cataract surgery have been reviewed with the patient.  Questions have been answered.  All parties agreeable.   Galen Manila, MD  10/31/2023, 10:39 AM

## 2023-10-31 NOTE — Transfer of Care (Signed)
Immediate Anesthesia Transfer of Care Note  Patient: Jordan Christensen  Procedure(s) Performed: CATARACT EXTRACTION PHACO AND INTRAOCULAR LENS PLACEMENT (IOC) LEFT DIABETIC  CLAREON VIVITY TORIC  2.77  00:28.7 (Left: Eye)  Patient Location: PACU  Anesthesia Type:MAC  Level of Consciousness: awake, alert , and oriented  Airway & Oxygen Therapy: Patient Spontanous Breathing  Post-op Assessment: Report given to RN and Post -op Vital signs reviewed and stable  Post vital signs: Reviewed and stable  Last Vitals:  Vitals Value Taken Time  BP    Temp    Pulse 67 10/31/23 1107  Resp 12 10/31/23 1107  SpO2 96 % 10/31/23 1107    Last Pain:  Vitals:   10/31/23 1107  TempSrc:   PainSc: 0-No pain         Complications: No notable events documented.

## 2023-10-31 NOTE — Op Note (Signed)
PREOPERATIVE DIAGNOSIS:  Nuclear sclerotic cataract of the left eye.   POSTOPERATIVE DIAGNOSIS:  Nuclear sclerotic cataract of the left eye.   OPERATIVE PROCEDURE: Procedure(s): CATARACT EXTRACTION PHACO AND INTRAOCULAR LENS PLACEMENT (IOC) LEFT DIABETIC  CLAREON VIVITY TORIC  2.77  00:28.7   SURGEON:  Galen Manila, MD.   ANESTHESIA: 1.      Managed anesthesia care. 2.     0.105ml os Shugarcaine was instilled following the paracentesis [email or web address]   COMPLICATIONS:  None.   TECHNIQUE:   Stop and chop    DESCRIPTION OF PROCEDURE:  The patient was examined and consented in the preoperative holding area where the aforementioned topical anesthesia was applied to the left eye.  The patient was brought back to the Operating Room where he was sat upright on the gurney and given a target to fixate upon while the eye was marked at the 3:00 and 9:00 position.  The patient was then reclined on the operating table.  The eye was prepped and draped in the usual sterile ophthalmic fashion and a lid speculum was placed. A paracentesis was created with the side port blade and the anterior chamber was filled with viscoelastic. A near clear corneal incision was performed with the steel keratome. A continuous curvilinear capsulorrhexis was performed with a cystotome followed by the capsulorrhexis forceps. Hydrodissection and hydrodelineation were carried out with BSS on a blunt cannula. The lens was removed in a stop and chop technique and the remaining cortical material was removed with the irrigation-aspiration handpiece. The eye was inflated with viscoelastic and the CNWET lens was placed in the eye and rotated to within a few degrees of the predetermined orientation.  The remaining viscoelastic was removed from the eye.  The Sinskey hook was used to rotate the toric lens into its final resting place at 093 degrees.  0.1 ml of Vigamox was placed in the anterior chamber. The eye was inflated to a physiologic  pressure and found to be watertight.  The eye was dressed with Vigamox. The patient was given protective glasses to wear throughout the day and a shield with which to sleep tonight. The patient was also given drops with which to begin a drop regimen today and will follow-up with me in one day. Implant Name Type Inv. Item Serial No. Manufacturer Lot No. LRB No. Used Action  Santiago Bumpers TORIC   [id number] ALCON  Left 1 Implanted   Procedure(s): CATARACT EXTRACTION PHACO AND INTRAOCULAR LENS PLACEMENT (IOC) LEFT DIABETIC  CLAREON VIVITY TORIC  2.77  00:28.7 (Left)  Electronically signed: Galen Manila 11/19/202411:05 AM

## 2023-10-31 NOTE — Anesthesia Preprocedure Evaluation (Addendum)
Anesthesia Evaluation  Patient identified by MRN, date of birth, ID band Patient awake    Reviewed: Allergy & Precautions, H&P , NPO status , Patient's Chart, lab work & pertinent test results  Airway Mallampati: III  TM Distance: >3 FB Neck ROM: Full    Dental no notable dental hx.    Pulmonary asthma , former smoker   Pulmonary exam normal breath sounds clear to auscultation       Cardiovascular hypertension, Normal cardiovascular exam Rhythm:Regular Rate:Normal  04-05-23 office visit:  Sinus Tachycardia: 72-hour Holter monitor from 08/2019 revealed sinus rhythm with average heart rate of 99 bpm, range from 73 bpm - 145 bpm, no concerning arrhythmias. Reports overall improvement in symptoms, still has occasional episodes of palpitations which seem to be worse in the evening. HR controlled in clinic today in the 70s, pt reports this typically runs in the 80s at home. -Continue metoprolol tartrate 50 mg twice daily for heart rate control.     Neuro/Psych  Headaches PSYCHIATRIC DISORDERS Anxiety Depression     Neuromuscular disease negative neurological ROS  negative psych ROS   GI/Hepatic negative GI ROS, Neg liver ROS,GERD  ,,  Endo/Other  negative endocrine ROSdiabetes    Renal/GU negative Renal ROS  negative genitourinary   Musculoskeletal negative musculoskeletal ROS (+)    Abdominal   Peds negative pediatric ROS (+)  Hematology negative hematology ROS (+)   Anesthesia Other Findings Endometrioma  Chronic pelvic pain in female Fecal incontinence  Hyperlipidemia Depression  Insomnia Hypertension  Obesity Chronic sinusitis  Asthma GERD (gastroesophageal reflux disease) Headache Scab  H/O sinus tachycardia    Reproductive/Obstetrics negative OB ROS                              Anesthesia Physical Anesthesia Plan  ASA: 3  Anesthesia Plan: MAC   Post-op Pain Management:     Induction: Intravenous  PONV Risk Score and Plan:   Airway Management Planned: Natural Airway and Nasal Cannula  Additional Equipment:   Intra-op Plan:   Post-operative Plan:   Informed Consent: I have reviewed the patients History and Physical, chart, labs and discussed the procedure including the risks, benefits and alternatives for the proposed anesthesia with the patient or authorized representative who has indicated his/her understanding and acceptance.     Dental Advisory Given  Plan Discussed with: Anesthesiologist, CRNA and Surgeon  Anesthesia Plan Comments: (Patient consented for risks of anesthesia including but not limited to:  - adverse reactions to medications - damage to eyes, teeth, lips or other oral mucosa - nerve damage due to positioning  - sore throat or hoarseness - Damage to heart, brain, nerves, lungs, other parts of body or loss of life  Patient voiced understanding and assent.)         Anesthesia Quick Evaluation

## 2023-10-31 NOTE — Anesthesia Postprocedure Evaluation (Signed)
Anesthesia Post Note  Patient: Jordan Christensen  Procedure(s) Performed: CATARACT EXTRACTION PHACO AND INTRAOCULAR LENS PLACEMENT (IOC) LEFT DIABETIC  CLAREON VIVITY TORIC  2.77  00:28.7 (Left: Eye)  Patient location during evaluation: PACU Anesthesia Type: MAC Level of consciousness: awake and alert Pain management: pain level controlled Vital Signs Assessment: post-procedure vital signs reviewed and stable Respiratory status: spontaneous breathing, nonlabored ventilation, respiratory function stable and patient connected to nasal cannula oxygen Cardiovascular status: stable and blood pressure returned to baseline Postop Assessment: no apparent nausea or vomiting Anesthetic complications: no   No notable events documented.   Last Vitals:  Vitals:   10/31/23 1110 10/31/23 1113  BP:    Pulse: 82 69  Resp: 17 15  Temp:    SpO2: 94% 93%    Last Pain:  Vitals:   10/31/23 1113  TempSrc:   PainSc: 0-No pain                 Anamika Kueker C Jonice Cerra

## 2023-11-01 ENCOUNTER — Encounter: Payer: Self-pay | Admitting: Ophthalmology

## 2023-11-04 ENCOUNTER — Encounter: Payer: Self-pay | Admitting: Certified Registered Nurse Anesthetist

## 2023-11-07 NOTE — Progress Notes (Unsigned)
[facility] History and Physical   Primary Care Physician:  Lonie Peak, PA-C   Reason for Procedure:  Colon cancer screening  Plan:    Colonoscopy     HPI: Jordan Christensen is a 62 y.o. female status post colonoscopy in 2014, negative for neoplasia.  She presents for a repeat screening colonoscopy.   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    Past Surgical History:  Procedure Laterality Date   anal sphincter repair  1999   internal and external   APPENDECTOMY     BREAST BIOPSY Left 2009   BURCH PROCEDURE     and culdoplasty for stress urinary incontinence   CARPAL TUNNEL RELEASE     right and left   CATARACT EXTRACTION W/PHACO Left 10/31/2023   Procedure: CATARACT EXTRACTION PHACO AND INTRAOCULAR LENS PLACEMENT (IOC) LEFT DIABETIC  CLAREON VIVITY TORIC  2.77  00:28.7;  Surgeon: Galen Manila, MD;  Location: [facility];  Service: Ophthalmology;  Laterality: Left;   COLONOSCOPY  2014   DB-moveiprep(good)HPP-10 yr recall   KNEE SURGERY Left 2023   LEFT OOPHORECTOMY     MM BREAST STEREO BIOPSY LEFT ([facility] HX) Left 09/24/2008   TOTAL ABDOMINAL HYSTERECTOMY  1994   UPPER GASTROINTESTINAL ENDOSCOPY      Prior to Admission medications   Medication Sig Start Date End Date Taking? Authorizing Provider  Albuterol Sulfate (PROAIR HFA IN) Inhale into the lungs as needed.    [provider]  ascorbic acid (VITAMIN C) 500 MG tablet Take by mouth.    [provider]  Cranberry-Vitamin C ([facility]) 140-100 MG CAPS Take by mouth.    [provider]  Dulaglutide (TRULICITY) 0.75 MG/0.5ML SOPN Inject into the skin.  08/25/22   [provider]  DULoxetine (CYMBALTA) 30 MG capsule 60 mg.    [provider]  esomeprazole (NEXIUM) 40 MG capsule     [provider]  estradiol (ESTRACE) 0.1 MG/GM vaginal cream Place vaginally. 05/15/23   [provider]  ibuprofen (ADVIL) 600 MG tablet Take 1 tablet by mouth every 6 (six) hours as needed.    [provider]  losartan-hydrochlorothiazide (HYZAAR) 100-25 MG tablet Take 1 tablet by mouth daily.    [provider]  metFORMIN (GLUCOPHAGE-XR) 500 MG 24 hr tablet Take 500 mg by mouth 2 (two) times daily with a meal. 08/02/19   [provider]  metoprolol tartrate (LOPRESSOR) 50 MG tablet Take 1 tablet by mouth 2 (two) times daily. 11/11/19   [provider]  nabumetone (RELAFEN) 750 MG tablet Take 1 tablet (750 mg total) by mouth 2 (two) times daily as needed. 08/30/23   Kathryne Hitch, MD  pantoprazole (PROTONIX) 40 MG tablet Take 1 tablet (40 mg total) by mouth daily. 01/07/14   Hart Carwin, MD  Probiotic Product (PROBIOTIC PO) Take 1 capsule by mouth daily at 6 (six) AM.    [provider]  rosuvastatin (CRESTOR) 40 MG tablet Take 40 mg by mouth daily.    [provider]  trimethoprim (TRIMPEX) 100 MG tablet Take 100 mg by mouth at bedtime.    [provider]    Current Outpatient Medications  Medication Sig Dispense Refill   ascorbic acid (VITAMIN C) 500 MG tablet Take by mouth.     Cranberry-Vitamin C ([facility]) 140-100 MG CAPS Take by mouth.     DULoxetine (CYMBALTA) 30 MG capsule 60 mg.     esomeprazole (NEXIUM) 40 MG capsule      estradiol (ESTRACE) 0.1 MG/GM vaginal cream Place vaginally.     losartan-hydrochlorothiazide (HYZAAR) 100-25 MG tablet Take 1 tablet by mouth daily.     metFORMIN (GLUCOPHAGE-XR) 500 MG 24 hr tablet Take 500 mg by mouth 2 (two) times daily with a meal.     metoprolol tartrate (LOPRESSOR) 50 MG tablet Take 1  tablet by mouth 2 (two) times daily.     Probiotic Product (PROBIOTIC PO) Take 1 capsule by mouth daily at 6 (six) AM.     rosuvastatin (CRESTOR) 40 MG tablet Take 40 mg by mouth daily.     trimethoprim (TRIMPEX) 100 MG tablet Take 100 mg by mouth at bedtime.     Albuterol Sulfate (PROAIR HFA IN) Inhale into the lungs as needed.     Dulaglutide (TRULICITY) 0.75 MG/0.5ML SOPN Inject into the skin.     ibuprofen (ADVIL) 600 MG tablet Take 1 tablet by mouth every 6 (six) hours as needed.     nabumetone (RELAFEN) 750 MG tablet Take 1 tablet (750 mg total) by mouth 2 (two) times daily as needed. 60 tablet 1   pantoprazole (PROTONIX) 40 MG tablet Take 1 tablet (40 mg total) by mouth daily. 30 tablet 3   Current Facility-Administered Medications  Medication Dose Route Frequency Provider Last Rate Last Admin   0.9 %  sodium chloride infusion  500 mL Intravenous Continuous Iva Boop, MD        Allergies as of 11/08/2023 - Review Complete 11/08/2023  Allergen Reaction Noted   Misc. sulfonamide containing compounds Itching and Rash 10/19/2023   Sulfonamide derivatives Itching and Rash 05/08/2008    Family History  Problem Relation Age of Onset   Diabetes Mother    Heart disease Mother    Liver cancer Father    Heart disease Brother    Breast cancer Maternal Aunt    Colon cancer Neg Hx    Esophageal cancer Neg Hx    Rectal cancer Neg Hx    Stomach cancer Neg Hx    Colon polyps Neg Hx     Social History   Socioeconomic History   Marital status: Married    Spouse name: Not on file   Number of children: Not on file   Years of education: Not on file   Highest education level: Not on file  Occupational History   Not on file  Tobacco Use   Smoking status: Former    Current packs/day: 0.00    Average packs/day: 1 pack/day for 5.7 years (5.7 ttl pk-yrs)    Types: Cigarettes    Start date: 34    Quit date: 08/20/1988    Years since quitting: 35.2   Smokeless tobacco: Never   Vaping Use   Vaping status: Never Used  Substance and Sexual Activity   Alcohol use: Yes    Alcohol/week: 2.0 standard drinks of alcohol    Types: 2 Cans of beer per week    Comment: social   Drug use: No   Sexual activity:  Not on file  Other Topics Concern   Not on file  Social History Narrative   Not on file   Social Determinants of Health   Financial Resource Strain: Not on file  Food Insecurity: Not on file  Transportation Needs: Not on file  Physical Activity: Not on file  Stress: Not on file  Social Connections: Not on file  Intimate Partner Violence: Not on file    Review of Systems:  All other review of systems negative except as mentioned in the HPI.  Physical Exam: Vital signs BP 124/66   Pulse 74   Temp 97.9 F (36.6 C)   Ht 5\' 7"  (1.702 m)   Wt 200 lb (90.7 kg)   SpO2 95%   BMI 31.32 kg/m   General:   Alert,  Well-developed, well-nourished, pleasant and cooperative in NAD Lungs:  Clear throughout to auscultation.   Heart:  Regular rate and rhythm; no murmurs, clicks, rubs,  or gallops. Abdomen:  Soft, nontender and nondistended. Normal bowel sounds.   Neuro/Psych:  Alert and cooperative. Normal mood and affect. A and O x 3   @Jordan Christensen  Sena Slate, MD, [facility] [phone] (pager) 11/08/2023 9:32 AM@

## 2023-11-08 ENCOUNTER — Encounter: Payer: Self-pay | Admitting: Internal Medicine

## 2023-11-08 ENCOUNTER — Ambulatory Visit: Payer: 59 | Admitting: Internal Medicine

## 2023-11-08 VITALS — BP 148/82 | HR 66 | Temp 97.9°F | Resp 13 | Ht 67.0 in | Wt 200.0 lb

## 2023-11-08 DIAGNOSIS — K635 Polyp of colon: Secondary | ICD-10-CM | POA: Diagnosis not present

## 2023-11-08 DIAGNOSIS — D123 Benign neoplasm of transverse colon: Secondary | ICD-10-CM

## 2023-11-08 DIAGNOSIS — D125 Benign neoplasm of sigmoid colon: Secondary | ICD-10-CM

## 2023-11-08 DIAGNOSIS — D124 Benign neoplasm of descending colon: Secondary | ICD-10-CM

## 2023-11-08 DIAGNOSIS — Z1211 Encounter for screening for malignant neoplasm of colon: Secondary | ICD-10-CM

## 2023-11-08 MED ORDER — SODIUM CHLORIDE 0.9 % IV SOLN: 500.0000 mL | INTRAVENOUS | Status: DC

## 2023-11-08 NOTE — Op Note (Signed)
[facility] Patient Name: Jordan Christensen Procedure Date: 11/08/2023 9:36 AM MRN: [id number] Endoscopist: Iva Boop , MD, [id number] Age: 62 Referring MD:  Date of Birth: 24-May-1961 Gender: Female Account #: [id number] Procedure:                Colonoscopy Indications:              Screening for colorectal malignant neoplasm, Last                            colonoscopy: 2014 Medicines:                Monitored Anesthesia Care Procedure:                Pre-Anesthesia Assessment:                           - Prior to the procedure, a History and Physical                            was performed, and patient medications and                            allergies were reviewed. The patient's tolerance of                            previous anesthesia was also reviewed. The risks                            and benefits of the procedure and the sedation                            options and risks were discussed with the patient.                            All questions were answered, and informed consent                            was obtained. Prior Anticoagulants: The patient has                            taken no anticoagulant or antiplatelet agents. ASA                            Grade Assessment: II - A patient with mild systemic                            disease. After reviewing the risks and benefits,                            the patient was deemed in satisfactory condition to                            undergo the procedure.  After obtaining informed consent, the colonoscope                            was passed under direct vision. Throughout the                            procedure, the patient's blood pressure, pulse, and                            oxygen saturations were monitored continuously. The                            Olympus Scope SN: [id number] was introduced through                            the anus and advanced to the the cecum,  identified                            by appendiceal orifice and ileocecal valve. The                            colonoscopy was somewhat difficult due to                            significant looping. Successful completion of the                            procedure was aided by applying abdominal pressure.                            The patient tolerated the procedure well. The                            quality of the bowel preparation was good. The                            bowel preparation used was SUPREP via split dose                            instruction. Scope In: 9:40:46 AM Scope Out: 10:06:08 AM Scope Withdrawal Time: 0 hours 19 minutes 25 seconds  Total Procedure Duration: 0 hours 25 minutes 22 seconds  Findings:                 The perianal and digital rectal examinations were                            normal.                           Five sessile polyps were found in the sigmoid                            colon, descending colon and transverse colon. The  polyps were diminutive in size. These polyps were                            removed with a cold snare. Resection and retrieval                            were complete. Verification of patient                            identification for the specimen was done. Estimated                            blood loss was minimal.                           External hemorrhoids were found.                           The exam was otherwise without abnormality on                            direct and retroflexion views. Complications:            No immediate complications. Estimated Blood Loss:     Estimated blood loss was minimal. Impression:               - Five diminutive polyps in the sigmoid colon, in                            the descending colon and in the transverse colon,                            removed with a cold snare. Resected and retrieved.                           - External  hemorrhoids.                           - The examination was otherwise normal on direct                            and retroflexion views. Recommendation:           - Patient has a contact number available for                            emergencies. The signs and symptoms of potential                            delayed complications were discussed with the                            patient. Return to normal activities tomorrow.                            Written discharge instructions were provided  to the                            patient.                           - Resume previous diet.                           - Continue present medications.                           - Repeat colonoscopy is recommended. The                            colonoscopy date will be determined after pathology                            results from today's exam become available for                            review. Iva Boop, MD 11/08/2023 10:12:50 AM This report has been signed electronically.

## 2023-11-08 NOTE — Patient Instructions (Addendum)
I found and removed 5 small polyps today.  I will let you know pathology results and when to have another routine colonoscopy by mail and/or My Chart.  Hemorrhoids also seen.  I appreciate the opportunity to care for you. Iva Boop, MD, [facility]  Await pathology results. Resume prior diet and medications.  Handout on polyps provided.  YOU HAD AN ENDOSCOPIC PROCEDURE TODAY AT THE [facility]:   Refer to the procedure report that was given to you for any specific questions about what was found during the examination.  If the procedure report does not answer your questions, please call your gastroenterologist to clarify.  If you requested that your care partner not be given the details of your procedure findings, then the procedure report has been included in a sealed envelope for you to review at your convenience later.  YOU SHOULD EXPECT: Some feelings of bloating in the abdomen. Passage of more gas than usual.  Walking can help get rid of the air that was put into your GI tract during the procedure and reduce the bloating. If you had a lower endoscopy (such as a colonoscopy or flexible sigmoidoscopy) you may notice spotting of blood in your stool or on the toilet paper. If you underwent a bowel prep for your procedure, you may not have a normal bowel movement for a few days.  Please Note:  You might notice some irritation and congestion in your nose or some drainage.  This is from the oxygen used during your procedure.  There is no need for concern and it should clear up in a day or so.  SYMPTOMS TO REPORT IMMEDIATELY:  Following lower endoscopy (colonoscopy or flexible sigmoidoscopy):  Excessive amounts of blood in the stool  Significant tenderness or worsening of abdominal pains  Swelling of the abdomen that is new, acute  Fever of 100F or higher  For urgent or emergent issues, a gastroenterologist can be reached at any hour by calling [phone]. Do not use MyChart  messaging for urgent concerns.    DIET:  We do recommend a small meal at first, but then you may proceed to your regular diet.  Drink plenty of fluids but you should avoid alcoholic beverages for 24 hours.  ACTIVITY:  You should plan to take it easy for the rest of today and you should NOT DRIVE or use heavy machinery until tomorrow (because of the sedation medicines used during the test).    FOLLOW UP: Our staff will call the number listed on your records the next business day following your procedure.  We will call around 7:15- 8:00 am to check on you and address any questions or concerns that you may have regarding the information given to you following your procedure. If we do not reach you, we will leave a message.     If any biopsies were taken you will be contacted by phone or by letter within the next 1-3 weeks.  Please call us at [phone] if you have not heard about the biopsies in 3 weeks.    SIGNATURES/CONFIDENTIALITY: You and/or your care partner have signed paperwork which will be entered into your electronic medical record.  These signatures attest to the fact that that the information above on your After Visit Summary has been reviewed and is understood.  Full responsibility of the confidentiality of this discharge information lies with you and/or your care-partner.

## 2023-11-08 NOTE — Progress Notes (Signed)
Called to room to assist during endoscopic procedure.  Patient ID and intended procedure confirmed with present staff. Received instructions for my participation in the procedure from the performing physician.  

## 2023-11-08 NOTE — Discharge Instructions (Signed)

## 2023-11-08 NOTE — Progress Notes (Signed)
Pt's states no medical or surgical changes since previsit or office visit. 

## 2023-11-08 NOTE — Progress Notes (Signed)
Report given to PACU, vss 

## 2023-11-13 ENCOUNTER — Telehealth: Payer: Self-pay

## 2023-11-13 NOTE — Telephone Encounter (Signed)
Left message on follow up call. 

## 2023-11-14 ENCOUNTER — Ambulatory Visit
Admission: RE | Admit: 2023-11-14 | Discharge: 2023-11-14 | Disposition: A | Discharge status: Home / Self Care | Payer: 59 | Attending: Ophthalmology | Admitting: Ophthalmology

## 2023-11-14 ENCOUNTER — Ambulatory Visit: Payer: 59 | Admitting: Anesthesiology

## 2023-11-14 ENCOUNTER — Encounter: Payer: Self-pay | Admitting: Ophthalmology

## 2023-11-14 ENCOUNTER — Other Ambulatory Visit: Payer: Self-pay

## 2023-11-14 ENCOUNTER — Encounter
Admission: RE | Disposition: A | Discharge status: Home / Self Care | Payer: Self-pay | Source: Home / Self Care | Attending: Ophthalmology

## 2023-11-14 DIAGNOSIS — F32A Depression, unspecified: Secondary | ICD-10-CM | POA: Insufficient documentation

## 2023-11-14 DIAGNOSIS — Z7984 Long term (current) use of oral hypoglycemic drugs: Secondary | ICD-10-CM | POA: Diagnosis not present

## 2023-11-14 DIAGNOSIS — K219 Gastro-esophageal reflux disease without esophagitis: Secondary | ICD-10-CM | POA: Insufficient documentation

## 2023-11-14 DIAGNOSIS — E1136 Type 2 diabetes mellitus with diabetic cataract: Secondary | ICD-10-CM | POA: Diagnosis not present

## 2023-11-14 DIAGNOSIS — I10 Essential (primary) hypertension: Secondary | ICD-10-CM | POA: Insufficient documentation

## 2023-11-14 DIAGNOSIS — H2511 Age-related nuclear cataract, right eye: Secondary | ICD-10-CM | POA: Insufficient documentation

## 2023-11-14 DIAGNOSIS — Z87891 Personal history of nicotine dependence: Secondary | ICD-10-CM | POA: Diagnosis not present

## 2023-11-14 DIAGNOSIS — Z7985 Long-term (current) use of injectable non-insulin antidiabetic drugs: Secondary | ICD-10-CM | POA: Diagnosis not present

## 2023-11-14 HISTORY — PX: CATARACT EXTRACTION W/PHACO: SHX586

## 2023-11-14 LAB — SURGICAL PATHOLOGY

## 2023-11-14 LAB — GLUCOSE, CAPILLARY: Glucose-Capillary: 156 mg/dL — ABNORMAL HIGH (ref 70–99)

## 2023-11-14 SURGERY — PHACOEMULSIFICATION, CATARACT, WITH IOL INSERTION
Anesthesia: Monitor Anesthesia Care | Site: Eye | Laterality: Right

## 2023-11-14 MED ORDER — TETRACAINE HCL 0.5 % OP SOLN
OPHTHALMIC | Status: AC
  Filled 2023-11-14: qty 4

## 2023-11-14 MED ORDER — GLYCOPYRROLATE 0.2 MG/ML IJ SOLN
INTRAMUSCULAR | Status: AC
  Filled 2023-11-14: qty 1

## 2023-11-14 MED ORDER — SIGHTPATH DOSE#1 BSS IO SOLN
INTRAOCULAR | Status: DC | PRN
  Administered 2023-11-14: 44 mL via OPHTHALMIC

## 2023-11-14 MED ORDER — FENTANYL CITRATE (PF) 100 MCG/2ML IJ SOLN
INTRAMUSCULAR | Status: DC | PRN
  Administered 2023-11-14: 50 ug via INTRAVENOUS

## 2023-11-14 MED ORDER — SIGHTPATH DOSE#1 NA CHONDROIT SULF-NA HYALURON 40-17 MG/ML IO SOLN
INTRAOCULAR | Status: DC | PRN
  Administered 2023-11-14: 1 mL via INTRAOCULAR

## 2023-11-14 MED ORDER — BRIMONIDINE TARTRATE-TIMOLOL 0.2-0.5 % OP SOLN
OPHTHALMIC | Status: DC | PRN
  Administered 2023-11-14: 1 [drp] via OPHTHALMIC

## 2023-11-14 MED ORDER — ARMC OPHTHALMIC DILATING DROPS
1.0000 | OPHTHALMIC | Status: DC | PRN
Start: 2023-11-14 — End: 2023-11-14
  Administered 2023-11-14 (×3): 1 via OPHTHALMIC

## 2023-11-14 MED ORDER — SIGHTPATH DOSE#1 BSS IO SOLN
INTRAOCULAR | Status: DC | PRN
  Administered 2023-11-14: 2 mL

## 2023-11-14 MED ORDER — MOXIFLOXACIN HCL 0.5 % OP SOLN
OPHTHALMIC | Status: DC | PRN
  Administered 2023-11-14: .2 mL via OPHTHALMIC

## 2023-11-14 MED ORDER — FENTANYL CITRATE (PF) 100 MCG/2ML IJ SOLN
INTRAMUSCULAR | Status: AC
  Filled 2023-11-14: qty 2

## 2023-11-14 MED ORDER — TETRACAINE HCL 0.5 % OP SOLN
1.0000 [drp] | OPHTHALMIC | Status: DC | PRN
  Administered 2023-11-14 (×3): 1 [drp] via OPHTHALMIC

## 2023-11-14 MED ORDER — SODIUM CHLORIDE 0.9% FLUSH: 10.0000 mL | Freq: Two times a day (BID) | INTRAVENOUS | Status: DC

## 2023-11-14 MED ORDER — MIDAZOLAM HCL 2 MG/2ML IJ SOLN
INTRAMUSCULAR | Status: DC | PRN
  Administered 2023-11-14 (×2): 1 mg via INTRAVENOUS

## 2023-11-14 MED ORDER — SIGHTPATH DOSE#1 BSS IO SOLN
INTRAOCULAR | Status: DC | PRN
  Administered 2023-11-14: 15 mL via INTRAOCULAR

## 2023-11-14 MED ORDER — MIDAZOLAM HCL 2 MG/2ML IJ SOLN
INTRAMUSCULAR | Status: AC
  Filled 2023-11-14: qty 2

## 2023-11-14 SURGICAL SUPPLY — 13 items
ANGLE REVERSE CUT SHRT 25GA (CUTTER) ×1
CANNULA ANT/CHMB 27G (MISCELLANEOUS) IMPLANT
CANNULA ANT/CHMB 27GA (MISCELLANEOUS)
CATARACT SUITE SIGHTPATH (MISCELLANEOUS) ×1
CYSTOTOME ANGL RVRS SHRT 25G (CUTTER) ×1 IMPLANT
FEE CATARACT SUITE SIGHTPATH (MISCELLANEOUS) ×1 IMPLANT
GLOVE BIOGEL PI IND STRL 8 (GLOVE) ×1 IMPLANT
GLOVE SURG LX STRL 8.0 MICRO (GLOVE) ×1 IMPLANT
LENS CLAREON VIVITY TORIC 28.5 ×1 IMPLANT
LENS IOL CLRN VT TRC 5 28.0 IMPLANT
NDL FILTER BLUNT 18X1 1/2 (NEEDLE) ×1 IMPLANT
NEEDLE FILTER BLUNT 18X1 1/2 (NEEDLE) ×1
SYR 3ML LL SCALE MARK (SYRINGE) ×1 IMPLANT

## 2023-11-14 NOTE — Op Note (Signed)
PREOPERATIVE DIAGNOSIS:  Nuclear sclerotic cataract of the right eye.   POSTOPERATIVE DIAGNOSIS:  Nuclear sclerotic cataract of the right eye.   OPERATIVE PROCEDURE: Procedure(s): CATARACT EXTRACTION PHACO AND INTRAOCULAR LENS PLACEMENT (IOC) RIGHT DIABETIC  CLAREON VIVITY TORIC 5.85 00:41.6   SURGEON:  Galen Manila, MD.   ANESTHESIA: 1.      Managed anesthesia care. 2.     0.12ml of Shugarcaine was instilled following the paracentesis  Anesthesiologist: Yevette Edwards, MD CRNA: Lanell Matar, CRNA  COMPLICATIONS:  None.   TECHNIQUE:   Stop and chop    DESCRIPTION OF PROCEDURE:  The patient was examined and consented in the preoperative holding area where the aforementioned topical anesthesia was applied to the right eye.  The patient was brought back to the Operating Room where he was sat upright on the gurney and given a target to fixate upon while the eye was marked at the 3:00 and 9:00 position.  The patient was then reclined on the operating table.  The eye was prepped and draped in the usual sterile ophthalmic fashion and a lid speculum was placed. A paracentesis was created with the side port blade and the anterior chamber was filled with viscoelastic. A near clear corneal incision was performed with the steel keratome. A continuous curvilinear capsulorrhexis was performed with a cystotome followed by the capsulorrhexis forceps. Hydrodissection and hydrodelineation were carried out with BSS on a blunt cannula. The lens was removed in a stop and chop technique and the remaining cortical material was removed with the irrigation-aspiration handpiece. The eye was inflated with viscoelastic and the ZCT  lens  was placed in the eye and rotated to within a few degrees of the predetermined orientation.  The remaining viscoelastic was removed from the eye.  The Sinskey hook was used to rotate the toric lens into its final resting place at 106 degrees.  0. The eye was inflated to a physiologic  pressure and found to be watertight. 0.74ml of Vigamox was placed in the anterior chamber.  The eye was dressed with Vigamox.and Combigan The patient was given protective glasses to wear throughout the day and a shield with which to sleep tonight. The patient was also given drops with which to begin a drop regimen today and will follow-up with me in one day. Implant Name Type Inv. Item Serial No. Manufacturer Lot No. LRB No. Used Action  Clareon Vivity Toric IOL 28.0 Intraocular Lens  [id number]   Right 1 Implanted   Procedure(s): CATARACT EXTRACTION PHACO AND INTRAOCULAR LENS PLACEMENT (IOC) RIGHT DIABETIC  CLAREON VIVITY TORIC 5.85 00:41.6 (Right)  Electronically signed: Galen Manila 11/14/2023 7:49 AM

## 2023-11-14 NOTE — Anesthesia Postprocedure Evaluation (Signed)
Anesthesia Post Note  Patient: LAVEDA HUPP  Procedure(s) Performed: CATARACT EXTRACTION PHACO AND INTRAOCULAR LENS PLACEMENT (IOC) RIGHT DIABETIC  CLAREON VIVITY TORIC 5.85 00:41.6 (Right: Eye)  Patient location during evaluation: PACU Anesthesia Type: MAC Level of consciousness: awake and alert Pain management: pain level controlled Vital Signs Assessment: post-procedure vital signs reviewed and stable Respiratory status: spontaneous breathing, nonlabored ventilation, respiratory function stable and patient connected to nasal cannula oxygen Cardiovascular status: stable and blood pressure returned to baseline Postop Assessment: no apparent nausea or vomiting Anesthetic complications: no   No notable events documented.   Last Vitals:  Vitals:   11/14/23 0747 11/14/23 0753  BP: 118/72 115/66  Pulse: 63 67  Resp: 20 20  Temp: (!) 36.3 C (!) 36.4 C  SpO2: 95% 96%    Last Pain:  Vitals:   11/14/23 0753  PainSc: 0-No pain                 Yevette Edwards

## 2023-11-14 NOTE — H&P (Signed)
[facility]   Primary Care Physician:  Lonie Peak, PA-C Ophthalmologist: Dr. Druscilla Brownie  Pre-Procedure History & Physical: HPI:  Jordan Christensen is a 62 y.o. female here for cataract surgery.   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    Past Surgical History:  Procedure Laterality Date   anal sphincter repair  1999   internal and external   APPENDECTOMY     BREAST BIOPSY Left 2009   BURCH PROCEDURE     and culdoplasty for stress urinary incontinence   CARPAL TUNNEL RELEASE     right and left   CATARACT EXTRACTION W/PHACO Left 10/31/2023   Procedure: CATARACT EXTRACTION PHACO AND INTRAOCULAR LENS PLACEMENT (IOC) LEFT DIABETIC  CLAREON VIVITY TORIC  2.77  00:28.7;  Surgeon: Galen Manila, MD;  Location: [facility];  Service: Ophthalmology;  Laterality: Left;   COLONOSCOPY  2014   DB-moveiprep(good)HPP-10 yr recall   KNEE SURGERY Left 2023   LEFT OOPHORECTOMY     MM BREAST STEREO BIOPSY LEFT ([facility] HX) Left 09/24/2008   TOTAL ABDOMINAL HYSTERECTOMY  1994   UPPER GASTROINTESTINAL ENDOSCOPY      Prior to Admission medications   Medication Sig Start Date End Date Taking? Authorizing Provider  Albuterol Sulfate (PROAIR HFA IN) Inhale into the lungs as needed.   Yes [provider]  ascorbic acid (VITAMIN C) 500 MG tablet Take by mouth.   Yes [provider]  Cranberry-Vitamin C ([facility]) 140-100 MG CAPS Take by mouth.   Yes [provider]  Dulaglutide (TRULICITY) 0.75 MG/0.5ML SOPN Inject into the skin. 08/25/22  Yes [provider]  DULoxetine (CYMBALTA) 30 MG capsule 60 mg.   Yes [provider]  esomeprazole (NEXIUM) 40 MG capsule    Yes [provider]  estradiol (ESTRACE) 0.1 MG/GM vaginal cream Place vaginally. 05/15/23  Yes [provider]  ibuprofen (ADVIL) 600 MG tablet Take 1 tablet by mouth every 6 (six) hours as needed.   Yes [provider]  losartan-hydrochlorothiazide (HYZAAR) 100-25 MG tablet Take 1 tablet by mouth daily.   Yes [provider]  metFORMIN (GLUCOPHAGE-XR) 500 MG 24 hr tablet Take 500 mg by mouth 2 (two) times daily with a meal. 08/02/19  Yes [provider]  metoprolol tartrate (LOPRESSOR) 50 MG tablet Take 1 tablet by mouth 2 (two) times daily. 11/11/19  Yes [provider]  nabumetone (RELAFEN) 750 MG tablet Take 1 tablet (750 mg total) by mouth 2 (two) times daily as needed. 08/30/23  Yes Kathryne Hitch, MD  pantoprazole (PROTONIX) 40 MG tablet Take 1 tablet (40 mg total) by mouth daily. 01/07/14  Yes Hart Carwin, MD  Probiotic Product (PROBIOTIC PO) Take 1 capsule by mouth daily at 6 (six) AM.   Yes [provider]  rosuvastatin (CRESTOR) 40 MG tablet Take 40 mg by mouth daily.   Yes [provider]  trimethoprim (TRIMPEX) 100 MG tablet Take 100 mg by mouth at bedtime.   Yes [provider]    Allergies  as of 10/17/2023 - Review Complete 10/04/2023  Allergen Reaction Noted   Sulfonamide derivatives  05/08/2008    Family History  Problem Relation Age of Onset   Diabetes Mother    Heart disease Mother    Liver cancer Father    Heart disease Brother    Breast cancer Maternal Aunt    Colon cancer Neg Hx    Esophageal cancer Neg Hx    Rectal cancer Neg Hx    Stomach cancer Neg Hx    Colon polyps Neg Hx     Social History   Socioeconomic History   Marital status: Married    Spouse name: Not on file   Number of children: Not on file   Years of education: Not on file   Highest education level: Not on file  Occupational History   Not on file  Tobacco  Use   Smoking status: Former    Current packs/day: 0.00    Average packs/day: 1 pack/day for 5.7 years (5.7 ttl pk-yrs)    Types: Cigarettes    Start date: 17    Quit date: 08/20/1988    Years since quitting: 35.2   Smokeless tobacco: Never  Vaping Use   Vaping status: Never Used  Substance and Sexual Activity   Alcohol use: Yes    Alcohol/week: 2.0 standard drinks of alcohol    Types: 2 Cans of beer per week    Comment: social   Drug use: No   Sexual activity: Not on file  Other Topics Concern   Not on file  Social History Narrative   Not on file   Social Determinants of Health   Financial Resource Strain: Not on file  Food Insecurity: Not on file  Transportation Needs: Not on file  Physical Activity: Not on file  Stress: Not on file  Social Connections: Not on file  Intimate Partner Violence: Not on file    Review of Systems: See HPI, otherwise negative ROS  Physical Exam: BP (!) 145/72   Pulse 64   Temp 97.8 F (36.6 C)   Ht 5\' 7"  (1.702 m)   Wt 91.2 kg   SpO2 98%   BMI 31.48 kg/m  General:   Alert, cooperative in NAD Head:  Normocephalic and atraumatic. Respiratory:  Normal work of breathing. Cardiovascular:  RRR  Impression/Plan: Jordan Christensen is here for cataract surgery.  Risks, benefits, limitations, and alternatives regarding cataract surgery have been reviewed with the patient.  Questions have been answered.  All parties agreeable.   Galen Manila, MD  11/14/2023, 7:15 AM

## 2023-11-14 NOTE — Anesthesia Preprocedure Evaluation (Signed)
Anesthesia Evaluation  Patient identified by MRN, date of birth, ID band Patient awake    Reviewed: Allergy & Precautions, H&P , NPO status , Patient's Chart, lab work & pertinent test results, reviewed documented beta blocker date and time   Airway Mallampati: II  TM Distance: >3 FB Neck ROM: full    Dental no notable dental hx. (+) Teeth Intact   Pulmonary asthma , former smoker   Pulmonary exam normal breath sounds clear to auscultation       Cardiovascular Exercise Tolerance: Good hypertension, On Medications negative cardio ROS  Rhythm:regular Rate:Normal     Neuro/Psych  Headaches  Anxiety Depression     Neuromuscular disease  negative psych ROS   GI/Hepatic Neg liver ROS,GERD  Medicated,,  Endo/Other  negative endocrine ROSdiabetes, Well Controlled    Renal/GU      Musculoskeletal   Abdominal   Peds  Hematology negative hematology ROS (+)   Anesthesia Other Findings   Reproductive/Obstetrics negative OB ROS                             Anesthesia Physical Anesthesia Plan  ASA: 2  Anesthesia Plan: MAC   Post-op Pain Management:    Induction:   PONV Risk Score and Plan: 2  Airway Management Planned:   Additional Equipment:   Intra-op Plan:   Post-operative Plan:   Informed Consent: I have reviewed the patients History and Physical, chart, labs and discussed the procedure including the risks, benefits and alternatives for the proposed anesthesia with the patient or authorized representative who has indicated his/her understanding and acceptance.       Plan Discussed with: CRNA  Anesthesia Plan Comments:        Anesthesia Quick Evaluation

## 2023-11-14 NOTE — Transfer of Care (Signed)
Immediate Anesthesia Transfer of Care Note  Patient: Jordan Christensen  Procedure(s) Performed: CATARACT EXTRACTION PHACO AND INTRAOCULAR LENS PLACEMENT (IOC) RIGHT DIABETIC  CLAREON VIVITY TORIC 5.85 00:41.6 (Right: Eye)  Patient Location: PACU  Anesthesia Type:MAC  Level of Consciousness: awake, alert , and oriented  Airway & Oxygen Therapy: Patient Spontanous Breathing  Post-op Assessment: Report given to RN, Post -op Vital signs reviewed and stable, and Patient moving all extremities X 4  Post vital signs: Reviewed and stable  Last Vitals:  Vitals Value Taken Time  BP    Temp    Pulse    Resp    SpO2      Last Pain:  Vitals:   11/14/23 0646  PainSc: 0-No pain         Complications: No notable events documented.

## 2023-11-16 ENCOUNTER — Encounter: Payer: Self-pay | Admitting: Internal Medicine

## 2023-11-16 DIAGNOSIS — Z860101 Personal history of adenomatous and serrated colon polyps: Secondary | ICD-10-CM | POA: Insufficient documentation

## 2023-11-20 ENCOUNTER — Encounter: Payer: Self-pay | Admitting: Ophthalmology

## 2023-11-20 ENCOUNTER — Ambulatory Visit (INDEPENDENT_AMBULATORY_CARE_PROVIDER_SITE_OTHER): Payer: 59 | Admitting: Orthopaedic Surgery

## 2023-11-20 DIAGNOSIS — M7062 Trochanteric bursitis, left hip: Secondary | ICD-10-CM

## 2023-11-20 DIAGNOSIS — M25552 Pain in left hip: Secondary | ICD-10-CM | POA: Diagnosis not present

## 2023-11-20 DIAGNOSIS — M5416 Radiculopathy, lumbar region: Secondary | ICD-10-CM

## 2023-11-20 MED ORDER — LIDOCAINE HCL 1 % IJ SOLN
3.0000 mL | INTRAMUSCULAR | Status: AC | PRN
  Administered 2023-11-20: 3 mL

## 2023-11-20 MED ORDER — METHYLPREDNISOLONE ACETATE 40 MG/ML IJ SUSP
40.0000 mg | INTRAMUSCULAR | Status: AC | PRN
  Administered 2023-11-20: 40 mg via INTRA_ARTICULAR

## 2023-11-20 NOTE — Progress Notes (Signed)
The patient comes in today after having an S1 foraminal injection on the left side by Dr. Alvester Morin a month ago.  She says that has helped great.  She has no issues as a relates to her back right now.  She is requesting a steroid injection though in her left hip over the trochanteric area.  We have injected that area before back in March of this year.  She says overall though she is doing well and not have any radicular symptoms and no weakness in her legs.  Exam her left hip moves smoothly and fluidly with a little bit of pain over the trochanteric area on the lateral aspect.  She has a negative straight leg raise on the left side and 5 out of 5 strength of her left lower extremity.  She also has normal sensation.  I did place a steroid injection over left hip trochanteric area which she tolerated well.  At this point follow-up for her back and the hip can be as needed.  If things worsen anyway she needs to let us know and she knows that as well.    Procedure Note  Patient: Jordan Christensen             Date of Birth: [birth year]           MRN: [id number]             Visit Date: 11/20/2023  Procedures: Visit Diagnoses:  1. Lumbar radiculopathy   2. Trochanteric bursitis, left hip   3. Pain of left hip     Large Joint Inj: L greater trochanter on 11/20/2023 10:42 AM Indications: pain and diagnostic evaluation Details: 22 G 1.5 in needle, lateral approach  Arthrogram: No  Medications: 3 mL lidocaine 1 %; 40 mg methylPREDNISolone acetate 40 MG/ML Outcome: tolerated well, no immediate complications Procedure, treatment alternatives, risks and benefits explained, specific risks discussed. Consent was given by the patient. Immediately prior to procedure a time out was called to verify the correct patient, procedure, equipment, support staff and site/side marked as required. Patient was prepped and draped in the usual sterile fashion.

## 2023-12-04 ENCOUNTER — Telehealth: Payer: Self-pay | Admitting: Physical Medicine and Rehabilitation

## 2023-12-04 DIAGNOSIS — M5416 Radiculopathy, lumbar region: Secondary | ICD-10-CM

## 2023-12-04 NOTE — Telephone Encounter (Signed)
Patient called wants to get an appointment for injection in the back. CB#[phone]

## 2023-12-08 NOTE — Addendum Note (Signed)
Addended by: Ashok Norris on: 12/08/2023 10:11 AM   Modules accepted: Orders

## 2023-12-08 NOTE — Telephone Encounter (Signed)
noted 

## 2023-12-26 ENCOUNTER — Encounter: Payer: 59 | Admitting: Physical Medicine and Rehabilitation

## 2024-01-01 ENCOUNTER — Ambulatory Visit: Payer: 59 | Admitting: Physical Medicine and Rehabilitation

## 2024-01-01 ENCOUNTER — Other Ambulatory Visit: Payer: Self-pay

## 2024-01-01 DIAGNOSIS — M5416 Radiculopathy, lumbar region: Secondary | ICD-10-CM | POA: Diagnosis not present

## 2024-01-01 MED ORDER — METHYLPREDNISOLONE ACETATE 40 MG/ML IJ SUSP
40.0000 mg | Freq: Once | INTRAMUSCULAR | Status: AC
  Administered 2024-01-01: 40 mg

## 2024-01-01 NOTE — Patient Instructions (Signed)

## 2024-01-01 NOTE — Progress Notes (Signed)
Functional Pain Scale - descriptive words and definitions  Uncomfortable (3)  Pain is present but can complete all ADL's/sleep is slightly affected and passive distraction only gives marginal relief. Mild range order  Average Pain 2  L > R   +Driver, -BT, -Dye Allergies.

## 2024-01-10 NOTE — Procedures (Signed)
S1 Lumbosacral Transforaminal Epidural Steroid Injection - Sub-Pedicular Approach with Fluoroscopic Guidance   Patient: Jordan Christensen      Date of Birth: 1961/07/20 MRN: [id number] PCP: Lonie Peak, PA-C      Visit Date: 01/01/2024   Universal Protocol:    Date/Time: 01/29/258:36 PM  Consent Given By: the patient  Position:  PRONE  Additional Comments: Vital signs were monitored before and after the procedure. Patient was prepped and draped in the usual sterile fashion. The correct patient, procedure, and site was verified.   Injection Procedure Details:  Procedure Site One Meds Administered:  Meds ordered this encounter  Medications   methylPREDNISolone acetate (DEPO-MEDROL) injection 40 mg    Laterality: Left  Location/Site:  S1 Foramen   Needle size: 22 ga.  Needle type: Spinal  Needle Placement: Transforaminal  Findings:   -Comments: Excellent flow of contrast along the nerve, nerve root and into the epidural space.  Epidurogram: Contrast epidurogram showed no nerve root cut off or restricted flow pattern.  Procedure Details: After squaring off the sacral end-plate to get a true AP view, the C-arm was positioned so that the best possible view of the S1 foramen was visualized. The soft tissues overlying this structure were infiltrated with 2-3 ml. of 1% Lidocaine without Epinephrine.    The spinal needle was inserted toward the target using a "trajectory" view along the fluoroscope beam.  Under AP and lateral visualization, the needle was advanced so it did not puncture dura. Biplanar projections were used to confirm position. Aspiration was confirmed to be negative for CSF and/or blood. A 1-2 ml. volume of Isovue-250 was injected and flow of contrast was noted at each level. Radiographs were obtained for documentation purposes.   After attaining the desired flow of contrast documented above, a 0.5 to 1.0 ml test dose of 0.25% Marcaine was injected into each  respective transforaminal space.  The patient was observed for 90 seconds post injection.  After no sensory deficits were reported, and normal lower extremity motor function was noted,   the above injectate was administered so that equal amounts of the injectate were placed at each foramen (level) into the transforaminal epidural space.   Additional Comments:  No complications occurred Dressing: Band-Aid with 2 x 2 sterile gauze    Post-procedure details: Patient was observed during the procedure. Post-procedure instructions were reviewed.  Patient left the clinic in stable condition.

## 2024-01-10 NOTE — Progress Notes (Signed)
Jordan Christensen - 63 y.o. female MRN [id number]  Date of birth: 1961/06/04  Office Visit Note: Visit Date: 01/01/2024 PCP: Lonie Peak, PA-C Referred by: Lonie Peak, PA-C  Subjective: Chief Complaint  Patient presents with   Lower Back - Pain   HPI:  Jordan Christensen is a 63 y.o. female who comes in today for planned repeat Left S1-2  Lumbar Transforaminal epidural steroid injection with fluoroscopic guidance.  The patient has failed conservative care including home exercise, medications, time and activity modification.  This injection will be diagnostic and hopefully therapeutic.  Please see requesting physician notes for further details and justification. Patient received more than 50% pain relief from prior injection.  Consideration should be given to orthopedic spine or neurosurgery consultation for information on decompression type surgeries depending on lasting relief with the injection.  Referring: Rexene Edison, PA-C   ROS Otherwise per HPI.  Assessment & Plan: Visit Diagnoses:    ICD-10-CM   1. Lumbar radiculopathy  M54.16 XR C-ARM NO REPORT    Epidural Steroid injection    methylPREDNISolone acetate (DEPO-MEDROL) injection 40 mg      Plan: No additional findings.   Meds & Orders:  Meds ordered this encounter  Medications   methylPREDNISolone acetate (DEPO-MEDROL) injection 40 mg    Orders Placed This Encounter  Procedures   XR C-ARM NO REPORT   Epidural Steroid injection    Follow-up: Return for visit to requesting provider as needed.   Procedures: No procedures performed  S1 Lumbosacral Transforaminal Epidural Steroid Injection - Sub-Pedicular Approach with Fluoroscopic Guidance   Patient: Jordan Christensen      Date of Birth: Feb 06, 1961 MRN: [id number] PCP: Lonie Peak, PA-C      Visit Date: 01/01/2024   Universal Protocol:    Date/Time: 01/29/258:36 PM  Consent Given By: the patient  Position:  PRONE  Additional Comments: Vital signs were  monitored before and after the procedure. Patient was prepped and draped in the usual sterile fashion. The correct patient, procedure, and site was verified.   Injection Procedure Details:  Procedure Site One Meds Administered:  Meds ordered this encounter  Medications   methylPREDNISolone acetate (DEPO-MEDROL) injection 40 mg    Laterality: Left  Location/Site:  S1 Foramen   Needle size: 22 ga.  Needle type: Spinal  Needle Placement: Transforaminal  Findings:   -Comments: Excellent flow of contrast along the nerve, nerve root and into the epidural space.  Epidurogram: Contrast epidurogram showed no nerve root cut off or restricted flow pattern.  Procedure Details: After squaring off the sacral end-plate to get a true AP view, the C-arm was positioned so that the best possible view of the S1 foramen was visualized. The soft tissues overlying this structure were infiltrated with 2-3 ml. of 1% Lidocaine without Epinephrine.    The spinal needle was inserted toward the target using a "trajectory" view along the fluoroscope beam.  Under AP and lateral visualization, the needle was advanced so it did not puncture dura. Biplanar projections were used to confirm position. Aspiration was confirmed to be negative for CSF and/or blood. A 1-2 ml. volume of Isovue-250 was injected and flow of contrast was noted at each level. Radiographs were obtained for documentation purposes.   After attaining the desired flow of contrast documented above, a 0.5 to 1.0 ml test dose of 0.25% Marcaine was injected into each respective transforaminal space.  The patient was observed for 90 seconds post injection.  After no sensory deficits were  reported, and normal lower extremity motor function was noted,   the above injectate was administered so that equal amounts of the injectate were placed at each foramen (level) into the transforaminal epidural space.   Additional Comments:  No complications  occurred Dressing: Band-Aid with 2 x 2 sterile gauze    Post-procedure details: Patient was observed during the procedure. Post-procedure instructions were reviewed.  Patient left the clinic in stable condition.   Clinical History: MRI LUMBAR SPINE WITHOUT CONTRAST   TECHNIQUE: Multiplanar, multisequence MR imaging of the lumbar spine was performed. No intravenous contrast was administered.   COMPARISON:  Lumbar spine radiographs 08/21/2023.   FINDINGS: Segmentation: Conventional numbering is assumed with 5 non-rib-bearing, lumbar type vertebral bodies.   Alignment:  Grade 1 anterolisthesis of L4 on L5.   Vertebrae: Modic type 2 degenerative endplate marrow signal changes at L5-S1.   Conus medullaris and cauda equina: Conus extends to the L1 level. Conus and cauda equina appear normal.   Paraspinal and other soft tissues: No acute findings.   Disc levels:   T12-L1:  Normal.   L1-L2: Small disc bulge without spinal canal stenosis or neural foraminal narrowing.   L2-L3: Left eccentric disc bulge and facet arthropathy results in mild spinal canal stenosis and moderate left neural foraminal narrowing.   L3-L4: Disc bulge and facet arthropathy results in mild spinal canal stenosis.   L4-L5: Anterolisthesis with uncovered disc and moderate bilateral facet arthropathy results in mild-to-moderate spinal canal stenosis and moderate bilateral neural foraminal narrowing.   L5-S1: Central disc extrusion with caudal migration results in compression of the traversing bilateral S1 nerve roots in the subarticular zones. Facet arthropathy contributes to moderate bilateral neural foraminal narrowing.   IMPRESSION: 1. Multilevel lumbar spondylosis, worst at L5-S1, where a central disc extrusion with caudal migration results in compression of the traversing bilateral S1 nerve roots in the subarticular zones. 2. Mild-to-moderate spinal canal stenosis and moderate  bilateral neural foraminal narrowing at L4-L5. 3. Mild spinal canal stenosis and moderate left neural foraminal narrowing at L2-L3.     Electronically Signed   By: Orvan Falconer M.D.   On: 10/02/2023 14:35     Objective:  VS:  HT:    WT:   BMI:     BP:   HR: bpm  TEMP: ( )  RESP:  Physical Exam Vitals and nursing note reviewed.  Constitutional:      General: She is not in acute distress.    Appearance: Normal appearance. She is well-developed. She is obese. She is not ill-appearing.  HENT:     Head: Normocephalic and atraumatic.     Right Ear: External ear normal.     Left Ear: External ear normal.  Eyes:     Extraocular Movements: Extraocular movements intact.     Conjunctiva/sclera: Conjunctivae normal.     Pupils: Pupils are equal, round, and reactive to light.  Cardiovascular:     Rate and Rhythm: Normal rate.     Pulses: Normal pulses.  Pulmonary:     Effort: Pulmonary effort is normal. No respiratory distress.  Abdominal:     General: There is no distension.     Palpations: Abdomen is soft.  Musculoskeletal:        General: Tenderness present.     Cervical back: Neck supple.     Right lower leg: No edema.     Left lower leg: No edema.     Comments: Patient has good distal strength with no pain over  the greater trochanters.  No clonus or focal weakness.  Skin:    General: Skin is warm and dry.     Findings: No erythema, lesion or rash.  Neurological:     General: No focal deficit present.     Mental Status: She is alert and oriented to person, place, and time.     Sensory: No sensory deficit.     Motor: No weakness or abnormal muscle tone.     Coordination: Coordination normal.     Gait: Gait normal.  Psychiatric:        Mood and Affect: Mood normal.        Behavior: Behavior normal.      Imaging: No results found.

## 2024-03-06 ENCOUNTER — Other Ambulatory Visit: Payer: Self-pay | Admitting: Physician Assistant

## 2024-03-06 DIAGNOSIS — Z1231 Encounter for screening mammogram for malignant neoplasm of breast: Secondary | ICD-10-CM

## 2024-03-11 ENCOUNTER — Telehealth: Payer: Self-pay | Admitting: Physical Medicine and Rehabilitation

## 2024-03-11 DIAGNOSIS — M5116 Intervertebral disc disorders with radiculopathy, lumbar region: Secondary | ICD-10-CM

## 2024-03-11 DIAGNOSIS — M5416 Radiculopathy, lumbar region: Secondary | ICD-10-CM

## 2024-03-11 NOTE — Addendum Note (Signed)
 Addended by: Ashok Norris on: 03/11/2024 04:01 PM   Modules accepted: Orders

## 2024-03-11 NOTE — Telephone Encounter (Signed)
 Patient called and wants to get an injection. CB#[phone]

## 2024-03-25 ENCOUNTER — Ambulatory Visit (INDEPENDENT_AMBULATORY_CARE_PROVIDER_SITE_OTHER): Admitting: Physical Medicine and Rehabilitation

## 2024-03-25 ENCOUNTER — Other Ambulatory Visit: Payer: Self-pay

## 2024-03-25 VITALS — BP 130/83 | HR 79

## 2024-03-25 DIAGNOSIS — M48062 Spinal stenosis, lumbar region with neurogenic claudication: Secondary | ICD-10-CM

## 2024-03-25 DIAGNOSIS — M5416 Radiculopathy, lumbar region: Secondary | ICD-10-CM | POA: Diagnosis not present

## 2024-03-25 DIAGNOSIS — M5116 Intervertebral disc disorders with radiculopathy, lumbar region: Secondary | ICD-10-CM

## 2024-03-25 MED ORDER — METHYLPREDNISOLONE ACETATE 40 MG/ML IJ SUSP
40.0000 mg | Freq: Once | INTRAMUSCULAR | Status: AC
  Administered 2024-03-25: 40 mg

## 2024-03-25 NOTE — Progress Notes (Unsigned)
 Pain Scale   Average Pain 2 Patient advising her pain starts when doing daily activities and only lessens when she sits and places ice on area        +Driver, -BT, -Dye Allergies.

## 2024-03-25 NOTE — Patient Instructions (Signed)

## 2024-03-26 ENCOUNTER — Ambulatory Visit

## 2024-03-27 ENCOUNTER — Ambulatory Visit
Admission: RE | Admit: 2024-03-27 | Discharge: 2024-03-27 | Disposition: A | Discharge status: Home / Self Care | Source: Ambulatory Visit | Attending: Physician Assistant | Admitting: Physician Assistant

## 2024-03-27 DIAGNOSIS — Z1231 Encounter for screening mammogram for malignant neoplasm of breast: Secondary | ICD-10-CM

## 2024-03-28 NOTE — Progress Notes (Signed)
 Jordan Christensen - 63 y.o. female MRN [id number]  Date of birth: 09-06-1961  Office Visit Note: Visit Date: 03/25/2024 PCP: Lonie Peak, PA-C Referred by: Lonie Peak, PA-C  Subjective: Chief Complaint  Patient presents with   Lower Back - Pain   HPI:  Jordan Christensen is a 63 y.o. female who comes in today for planned repeat Left S1-2  Lumbar Transforaminal epidural steroid injection with fluoroscopic guidance.  The patient has failed conservative care including home exercise, medications, time and activity modification.  This injection will be diagnostic and hopefully therapeutic.  Please see requesting physician notes for further details and justification. Patient received more than 50% pain relief from prior injection.  Depending on relief would consider interlaminar injection for more stenosis and listhesis at L4-5.  She also has a good candidate for microdiscectomy depending on the results of the injections go.  Referring: Dr. Doneen Poisson   ROS Otherwise per HPI.  Assessment & Plan: Visit Diagnoses:    ICD-10-CM   1. Lumbar radiculopathy  M54.16 XR C-ARM NO REPORT    Epidural Steroid injection    methylPREDNISolone acetate (DEPO-MEDROL) injection 40 mg    2. Radiculopathy due to lumbar intervertebral disc disorder  M51.16     3. Spinal stenosis of lumbar region with neurogenic claudication  M48.062       Plan: No additional findings.   Meds & Orders:  Meds ordered this encounter  Medications   methylPREDNISolone acetate (DEPO-MEDROL) injection 40 mg    Orders Placed This Encounter  Procedures   XR C-ARM NO REPORT   Epidural Steroid injection    Follow-up: No follow-ups on file.   Procedures: No procedures performed      Clinical History: MRI LUMBAR SPINE WITHOUT CONTRAST   TECHNIQUE: Multiplanar, multisequence MR imaging of the lumbar spine was performed. No intravenous contrast was administered.   COMPARISON:  Lumbar spine radiographs  08/21/2023.   FINDINGS: Segmentation: Conventional numbering is assumed with 5 non-rib-bearing, lumbar type vertebral bodies.   Alignment:  Grade 1 anterolisthesis of L4 on L5.   Vertebrae: Modic type 2 degenerative endplate marrow signal changes at L5-S1.   Conus medullaris and cauda equina: Conus extends to the L1 level. Conus and cauda equina appear normal.   Paraspinal and other soft tissues: No acute findings.   Disc levels:   T12-L1:  Normal.   L1-L2: Small disc bulge without spinal canal stenosis or neural foraminal narrowing.   L2-L3: Left eccentric disc bulge and facet arthropathy results in mild spinal canal stenosis and moderate left neural foraminal narrowing.   L3-L4: Disc bulge and facet arthropathy results in mild spinal canal stenosis.   L4-L5: Anterolisthesis with uncovered disc and moderate bilateral facet arthropathy results in mild-to-moderate spinal canal stenosis and moderate bilateral neural foraminal narrowing.   L5-S1: Central disc extrusion with caudal migration results in compression of the traversing bilateral S1 nerve roots in the subarticular zones. Facet arthropathy contributes to moderate bilateral neural foraminal narrowing.   IMPRESSION: 1. Multilevel lumbar spondylosis, worst at L5-S1, where a central disc extrusion with caudal migration results in compression of the traversing bilateral S1 nerve roots in the subarticular zones. 2. Mild-to-moderate spinal canal stenosis and moderate bilateral neural foraminal narrowing at L4-L5. 3. Mild spinal canal stenosis and moderate left neural foraminal narrowing at L2-L3.     Electronically Signed   By: Orvan Falconer M.D.   On: 10/02/2023 14:35     Objective:  VS:  HT:  WT:   BMI:     BP:130/83  HR:79bpm  TEMP: ( )  RESP:  Physical Exam Vitals and nursing note reviewed.  Constitutional:      General: She is not in acute distress.    Appearance: Normal appearance. She is  not ill-appearing.  HENT:     Head: Normocephalic and atraumatic.     Right Ear: External ear normal.     Left Ear: External ear normal.  Eyes:     Extraocular Movements: Extraocular movements intact.  Cardiovascular:     Rate and Rhythm: Normal rate.     Pulses: Normal pulses.  Pulmonary:     Effort: Pulmonary effort is normal. No respiratory distress.  Abdominal:     General: There is no distension.     Palpations: Abdomen is soft.  Musculoskeletal:        General: Tenderness present.     Cervical back: Neck supple.     Right lower leg: No edema.     Left lower leg: No edema.     Comments: Patient has good distal strength with no pain over the greater trochanters.  No clonus or focal weakness.  Skin:    Findings: No erythema, lesion or rash.  Neurological:     General: No focal deficit present.     Mental Status: She is alert and oriented to person, place, and time.     Sensory: No sensory deficit.     Motor: No weakness or abnormal muscle tone.     Coordination: Coordination normal.  Psychiatric:        Mood and Affect: Mood normal.        Behavior: Behavior normal.      Imaging: No results found.

## 2024-03-28 NOTE — Procedures (Signed)
 S1 Lumbosacral Transforaminal Epidural Steroid Injection - Sub-Pedicular Approach with Fluoroscopic Guidance   Patient: Jordan Christensen      Date of Birth: 1961/08/06 MRN: [id number] PCP: Aloha Arnold, PA-C      Visit Date: 03/25/2024   Universal Protocol:    Date/Time: 04/17/257:40 AM  Consent Given By: the patient  Position:  PRONE  Additional Comments: Vital signs were monitored before and after the procedure. Patient was prepped and draped in the usual sterile fashion. The correct patient, procedure, and site was verified.   Injection Procedure Details:  Procedure Site One Meds Administered:  Meds ordered this encounter  Medications   methylPREDNISolone acetate (DEPO-MEDROL) injection 40 mg    Laterality: Left  Location/Site:  S1 Foramen   Needle size: 22 ga.  Needle type: Spinal  Needle Placement: Transforaminal  Findings:   -Comments: Excellent flow of contrast along the nerve, nerve root and into the epidural space.  Epidurogram: Contrast epidurogram showed no nerve root cut off or restricted flow pattern.  Procedure Details: After squaring off the sacral end-plate to get a true AP view, the C-arm was positioned so that the best possible view of the S1 foramen was visualized. The soft tissues overlying this structure were infiltrated with 2-3 ml. of 1% Lidocaine without Epinephrine.    The spinal needle was inserted toward the target using a "trajectory" view along the fluoroscope beam.  Under AP and lateral visualization, the needle was advanced so it did not puncture dura. Biplanar projections were used to confirm position. Aspiration was confirmed to be negative for CSF and/or blood. A 1-2 ml. volume of Isovue-250 was injected and flow of contrast was noted at each level. Radiographs were obtained for documentation purposes.   After attaining the desired flow of contrast documented above, a 0.5 to 1.0 ml test dose of 0.25% Marcaine was injected into each  respective transforaminal space.  The patient was observed for 90 seconds post injection.  After no sensory deficits were reported, and normal lower extremity motor function was noted,   the above injectate was administered so that equal amounts of the injectate were placed at each foramen (level) into the transforaminal epidural space.   Additional Comments:  The patient tolerated the procedure well Dressing: Band-Aid with 2 x 2 sterile gauze    Post-procedure details: Patient was observed during the procedure. Post-procedure instructions were reviewed.  Patient left the clinic in stable condition.

## 2024-03-29 ENCOUNTER — Other Ambulatory Visit: Payer: Self-pay | Admitting: Physician Assistant

## 2024-03-29 DIAGNOSIS — R928 Other abnormal and inconclusive findings on diagnostic imaging of breast: Secondary | ICD-10-CM

## 2024-04-11 ENCOUNTER — Ambulatory Visit
Admission: RE | Admit: 2024-04-11 | Discharge: 2024-04-11 | Disposition: A | Discharge status: Home / Self Care | Source: Ambulatory Visit | Attending: Physician Assistant | Admitting: Physician Assistant

## 2024-04-11 DIAGNOSIS — R928 Other abnormal and inconclusive findings on diagnostic imaging of breast: Secondary | ICD-10-CM

## 2024-04-12 ENCOUNTER — Other Ambulatory Visit: Payer: Self-pay | Admitting: Physician Assistant

## 2024-04-12 DIAGNOSIS — R921 Mammographic calcification found on diagnostic imaging of breast: Secondary | ICD-10-CM

## 2024-04-15 ENCOUNTER — Encounter: Payer: Self-pay | Admitting: Physical Medicine and Rehabilitation

## 2024-04-17 ENCOUNTER — Ambulatory Visit
Admission: RE | Admit: 2024-04-17 | Discharge: 2024-04-17 | Disposition: A | Discharge status: Home / Self Care | Source: Ambulatory Visit | Attending: Physician Assistant | Admitting: Physician Assistant

## 2024-04-17 DIAGNOSIS — R921 Mammographic calcification found on diagnostic imaging of breast: Secondary | ICD-10-CM

## 2024-04-17 HISTORY — PX: BREAST BIOPSY: SHX20

## 2024-04-18 LAB — SURGICAL PATHOLOGY

## 2024-07-11 ENCOUNTER — Telehealth: Payer: Self-pay | Admitting: Physical Medicine and Rehabilitation

## 2024-07-11 NOTE — Telephone Encounter (Signed)
 Patient request to have another injection

## 2024-07-12 ENCOUNTER — Other Ambulatory Visit: Payer: Self-pay | Admitting: Physical Medicine and Rehabilitation

## 2024-07-12 DIAGNOSIS — M5416 Radiculopathy, lumbar region: Secondary | ICD-10-CM

## 2024-08-03 ENCOUNTER — Other Ambulatory Visit: Payer: Self-pay | Admitting: Medical Genetics

## 2024-08-08 ENCOUNTER — Other Ambulatory Visit: Payer: Self-pay

## 2024-08-08 ENCOUNTER — Ambulatory Visit (INDEPENDENT_AMBULATORY_CARE_PROVIDER_SITE_OTHER): Admitting: Physical Medicine and Rehabilitation

## 2024-08-08 VITALS — BP 135/80 | HR 66

## 2024-08-08 DIAGNOSIS — M5416 Radiculopathy, lumbar region: Secondary | ICD-10-CM | POA: Diagnosis not present

## 2024-08-08 DIAGNOSIS — M5116 Intervertebral disc disorders with radiculopathy, lumbar region: Secondary | ICD-10-CM

## 2024-08-08 MED ORDER — METHYLPREDNISOLONE ACETATE 40 MG/ML IJ SUSP
40.0000 mg | Freq: Once | INTRAMUSCULAR | Status: AC
  Administered 2024-08-08: 40 mg

## 2024-08-08 NOTE — Procedures (Signed)
 S1 Lumbosacral Transforaminal Epidural Steroid Injection - Sub-Pedicular Approach with Fluoroscopic Guidance   Patient: Jordan Christensen      Date of Birth: July 31, 1961 MRN: [id number] PCP: Montey Lot, PA-C      Visit Date: 08/08/2024   Universal Protocol:    Date/Time: 08/08/2509:44 AM  Consent Given By: the patient  Position:  PRONE  Additional Comments: Vital signs were monitored before and after the procedure. Patient was prepped and draped in the usual sterile fashion. The correct patient, procedure, and site was verified.   Injection Procedure Details:  Procedure Site One Meds Administered:  Meds ordered this encounter  Medications   methylPREDNISolone  acetate (DEPO-MEDROL ) injection 40 mg    Laterality: Left  Location/Site:  S1 Foramen   Needle size: 22 ga.  Needle type: Spinal  Needle Placement: Transforaminal  Findings:   -Comments: Excellent flow of contrast along the nerve, nerve root and into the epidural space.  Epidurogram: Contrast epidurogram showed no nerve root cut off or restricted flow pattern.  Procedure Details: After squaring off the sacral end-plate to get a true AP view, the C-arm was positioned so that the best possible view of the S1 foramen was visualized. The soft tissues overlying this structure were infiltrated with 2-3 ml. of 1% Lidocaine  without Epinephrine .    The spinal needle was inserted toward the target using a trajectory view along the fluoroscope beam.  Under AP and lateral visualization, the needle was advanced so it did not puncture dura. Biplanar projections were used to confirm position. Aspiration was confirmed to be negative for CSF and/or blood. A 1-2 ml. volume of Isovue-250 was injected and flow of contrast was noted at each level. Radiographs were obtained for documentation purposes.   After attaining the desired flow of contrast documented above, a 0.5 to 1.0 ml test dose of 0.25% Marcaine  was injected into each  respective transforaminal space.  The patient was observed for 90 seconds post injection.  After no sensory deficits were reported, and normal lower extremity motor function was noted,   the above injectate was administered so that equal amounts of the injectate were placed at each foramen (level) into the transforaminal epidural space.   Additional Comments:  The patient tolerated the procedure well Dressing: Band-Aid with 2 x 2 sterile gauze    Post-procedure details: Patient was observed during the procedure. Post-procedure instructions were reviewed.  Patient left the clinic in stable condition.

## 2024-08-08 NOTE — Progress Notes (Signed)
 Pain Scale   Average Pain 4 Patient advising she has lower back pain that increases when doing household chores, and decreases when resting. Pain radiates to left leg at times        +Driver, -BT, -Dye Allergies.

## 2024-08-08 NOTE — Progress Notes (Signed)
 Jordan Christensen - 63 y.o. female MRN [id number]  Date of birth: 26-Apr-1961  Office Visit Note: Visit Date: 08/08/2024 PCP: Montey Lot, PA-C Referred by: Montey Lot, PA-C  Subjective: Chief Complaint  Patient presents with   Lower Back - Pain   HPI:  Jordan Christensen is a 63 y.o. female who comes in today for planned repeat Left S1-2  Lumbar Transforaminal epidural steroid injection with fluoroscopic guidance.  The patient has failed conservative care including home exercise, medications, time and activity modification.  This injection will be diagnostic and hopefully therapeutic.  Please see requesting physician notes for further details and justification. Patient received more than 50% pain relief from prior injection.   Referring: Duwaine Pouch, FNP   ROS Otherwise per HPI.  Assessment & Plan: Visit Diagnoses:    ICD-10-CM   1. Lumbar radiculopathy  M54.16 XR C-ARM NO REPORT    Epidural Steroid injection    methylPREDNISolone  acetate (DEPO-MEDROL ) injection 40 mg    2. Radiculopathy due to lumbar intervertebral disc disorder  M51.16       Plan: No additional findings.   Meds & Orders:  Meds ordered this encounter  Medications   methylPREDNISolone  acetate (DEPO-MEDROL ) injection 40 mg    Orders Placed This Encounter  Procedures   XR C-ARM NO REPORT   Epidural Steroid injection    Follow-up: Return for visit to requesting provider as needed.   Procedures: No procedures performed  S1 Lumbosacral Transforaminal Epidural Steroid Injection - Sub-Pedicular Approach with Fluoroscopic Guidance   Patient: Jordan Christensen      Date of Birth: 1961-01-24 MRN: [id number] PCP: Montey Lot, PA-C      Visit Date: 08/08/2024   Universal Protocol:    Date/Time: 08/08/2509:44 AM  Consent Given By: the patient  Position:  PRONE  Additional Comments: Vital signs were monitored before and after the procedure. Patient was prepped and draped in the usual sterile  fashion. The correct patient, procedure, and site was verified.   Injection Procedure Details:  Procedure Site One Meds Administered:  Meds ordered this encounter  Medications   methylPREDNISolone  acetate (DEPO-MEDROL ) injection 40 mg    Laterality: Left  Location/Site:  S1 Foramen   Needle size: 22 ga.  Needle type: Spinal  Needle Placement: Transforaminal  Findings:   -Comments: Excellent flow of contrast along the nerve, nerve root and into the epidural space.  Epidurogram: Contrast epidurogram showed no nerve root cut off or restricted flow pattern.  Procedure Details: After squaring off the sacral end-plate to get a true AP view, the C-arm was positioned so that the best possible view of the S1 foramen was visualized. The soft tissues overlying this structure were infiltrated with 2-3 ml. of 1% Lidocaine  without Epinephrine .    The spinal needle was inserted toward the target using a trajectory view along the fluoroscope beam.  Under AP and lateral visualization, the needle was advanced so it did not puncture dura. Biplanar projections were used to confirm position. Aspiration was confirmed to be negative for CSF and/or blood. A 1-2 ml. volume of Isovue-250 was injected and flow of contrast was noted at each level. Radiographs were obtained for documentation purposes.   After attaining the desired flow of contrast documented above, a 0.5 to 1.0 ml test dose of 0.25% Marcaine  was injected into each respective transforaminal space.  The patient was observed for 90 seconds post injection.  After no sensory deficits were reported, and normal lower extremity motor function was noted,  the above injectate was administered so that equal amounts of the injectate were placed at each foramen (level) into the transforaminal epidural space.   Additional Comments:  The patient tolerated the procedure well Dressing: Band-Aid with 2 x 2 sterile gauze    Post-procedure  details: Patient was observed during the procedure. Post-procedure instructions were reviewed.  Patient left the clinic in stable condition.   Clinical History: MRI LUMBAR SPINE WITHOUT CONTRAST   TECHNIQUE: Multiplanar, multisequence MR imaging of the lumbar spine was performed. No intravenous contrast was administered.   COMPARISON:  Lumbar spine radiographs 08/21/2023.   FINDINGS: Segmentation: Conventional numbering is assumed with 5 non-rib-bearing, lumbar type vertebral bodies.   Alignment:  Grade 1 anterolisthesis of L4 on L5.   Vertebrae: Modic type 2 degenerative endplate marrow signal changes at L5-S1.   Conus medullaris and cauda equina: Conus extends to the L1 level. Conus and cauda equina appear normal.   Paraspinal and other soft tissues: No acute findings.   Disc levels:   T12-L1:  Normal.   L1-L2: Small disc bulge without spinal canal stenosis or neural foraminal narrowing.   L2-L3: Left eccentric disc bulge and facet arthropathy results in mild spinal canal stenosis and moderate left neural foraminal narrowing.   L3-L4: Disc bulge and facet arthropathy results in mild spinal canal stenosis.   L4-L5: Anterolisthesis with uncovered disc and moderate bilateral facet arthropathy results in mild-to-moderate spinal canal stenosis and moderate bilateral neural foraminal narrowing.   L5-S1: Central disc extrusion with caudal migration results in compression of the traversing bilateral S1 nerve roots in the subarticular zones. Facet arthropathy contributes to moderate bilateral neural foraminal narrowing.   IMPRESSION: 1. Multilevel lumbar spondylosis, worst at L5-S1, where a central disc extrusion with caudal migration results in compression of the traversing bilateral S1 nerve roots in the subarticular zones. 2. Mild-to-moderate spinal canal stenosis and moderate bilateral neural foraminal narrowing at L4-L5. 3. Mild spinal canal stenosis and  moderate left neural foraminal narrowing at L2-L3.     Electronically Signed   By: Ryan Chess M.D.   On: 10/02/2023 14:35     Objective:  VS:  HT:    WT:   BMI:     BP:135/80  HR:66bpm  TEMP: ( )  RESP:  Physical Exam Vitals and nursing note reviewed.  Constitutional:      General: She is not in acute distress.    Appearance: Normal appearance. She is obese. She is not ill-appearing.  HENT:     Head: Normocephalic and atraumatic.     Right Ear: External ear normal.     Left Ear: External ear normal.  Eyes:     Extraocular Movements: Extraocular movements intact.  Cardiovascular:     Rate and Rhythm: Normal rate.     Pulses: Normal pulses.  Pulmonary:     Effort: Pulmonary effort is normal. No respiratory distress.  Abdominal:     General: There is no distension.     Palpations: Abdomen is soft.  Musculoskeletal:        General: Tenderness present.     Cervical back: Neck supple.     Right lower leg: No edema.     Left lower leg: No edema.     Comments: Patient has good distal strength with no pain over the greater trochanters.  No clonus or focal weakness.  Skin:    Findings: No erythema, lesion or rash.  Neurological:     General: No focal deficit present.  Mental Status: She is alert and oriented to person, place, and time.     Sensory: No sensory deficit.     Motor: No weakness or abnormal muscle tone.     Coordination: Coordination normal.  Psychiatric:        Mood and Affect: Mood normal.        Behavior: Behavior normal.      Imaging: XR C-ARM NO REPORT Result Date: 08/08/2024 Please see Notes tab for imaging impression.

## 2024-08-16 ENCOUNTER — Other Ambulatory Visit
Admission: RE | Admit: 2024-08-16 | Discharge: 2024-08-16 | Disposition: A | Discharge status: Home / Self Care | Payer: Self-pay | Source: Ambulatory Visit | Attending: Medical Genetics | Admitting: Medical Genetics

## 2024-08-26 LAB — GENECONNECT MOLECULAR SCREEN: Genetic Analysis Overall Interpretation: NEGATIVE

## 2024-10-14 ENCOUNTER — Encounter: Payer: Self-pay | Admitting: Radiology
